# Patient Record
Sex: Female | Born: 1950 | Race: White | Hispanic: No | Smoking: Former smoker
Health system: Southern US, Community
[De-identification: ages and names within clinical notes are randomized; demographics above are authoritative.]

## PROBLEM LIST (undated history)

## (undated) DIAGNOSIS — E785 Hyperlipidemia, unspecified: Secondary | ICD-10-CM

## (undated) DIAGNOSIS — Z972 Presence of dental prosthetic device (complete) (partial): Secondary | ICD-10-CM

## (undated) DIAGNOSIS — J302 Other seasonal allergic rhinitis: Secondary | ICD-10-CM

## (undated) DIAGNOSIS — R42 Dizziness and giddiness: Secondary | ICD-10-CM

## (undated) DIAGNOSIS — T451X5A Adverse effect of antineoplastic and immunosuppressive drugs, initial encounter: Secondary | ICD-10-CM

## (undated) DIAGNOSIS — K259 Gastric ulcer, unspecified as acute or chronic, without hemorrhage or perforation: Secondary | ICD-10-CM

## (undated) DIAGNOSIS — M1990 Unspecified osteoarthritis, unspecified site: Secondary | ICD-10-CM

## (undated) DIAGNOSIS — I10 Essential (primary) hypertension: Secondary | ICD-10-CM

## (undated) DIAGNOSIS — C679 Malignant neoplasm of bladder, unspecified: Secondary | ICD-10-CM

## (undated) DIAGNOSIS — C801 Malignant (primary) neoplasm, unspecified: Secondary | ICD-10-CM

## (undated) DIAGNOSIS — K219 Gastro-esophageal reflux disease without esophagitis: Secondary | ICD-10-CM

## (undated) DIAGNOSIS — G620 Drug-induced polyneuropathy: Secondary | ICD-10-CM

## (undated) DIAGNOSIS — Z87442 Personal history of urinary calculi: Secondary | ICD-10-CM

## (undated) DIAGNOSIS — I480 Paroxysmal atrial fibrillation: Secondary | ICD-10-CM

## (undated) DIAGNOSIS — C9001 Multiple myeloma in remission: Secondary | ICD-10-CM

## (undated) DIAGNOSIS — M109 Gout, unspecified: Secondary | ICD-10-CM

## (undated) DIAGNOSIS — E7800 Pure hypercholesterolemia, unspecified: Secondary | ICD-10-CM

## (undated) DIAGNOSIS — F419 Anxiety disorder, unspecified: Secondary | ICD-10-CM

## (undated) HISTORY — DX: Gastro-esophageal reflux disease without esophagitis: K21.9

## (undated) HISTORY — PX: ABDOMINAL HYSTERECTOMY: SHX81

## (undated) HISTORY — DX: Anxiety disorder, unspecified: F41.9

## (undated) HISTORY — PX: HERNIA REPAIR: SHX51

## (undated) HISTORY — PX: EYE SURGERY: SHX253

## (undated) HISTORY — DX: Hyperlipidemia, unspecified: E78.5

## (undated) HISTORY — DX: Unspecified osteoarthritis, unspecified site: M19.90

## (undated) HISTORY — PX: KIDNEY STONE SURGERY: SHX686

## (undated) HISTORY — DX: Gout, unspecified: M10.9

## (undated) HISTORY — DX: Gastric ulcer, unspecified as acute or chronic, without hemorrhage or perforation: K25.9

## (undated) HISTORY — DX: Personal history of urinary calculi: Z87.442

## (undated) HISTORY — PX: BLADDER ASPIRATION: SHX472

## (undated) HISTORY — PX: HEMORROIDECTOMY: SUR656

## (undated) HISTORY — DX: Malignant neoplasm of bladder, unspecified: C67.9

## (undated) HISTORY — PX: TUMOR REMOVAL: SHX12

## (undated) HISTORY — PX: GALLBLADDER SURGERY: SHX652

---

## 1997-03-07 HISTORY — PX: BLADDER SURGERY: SHX569

## 2011-06-16 DIAGNOSIS — E785 Hyperlipidemia, unspecified: Secondary | ICD-10-CM | POA: Insufficient documentation

## 2011-06-16 DIAGNOSIS — I10 Essential (primary) hypertension: Secondary | ICD-10-CM | POA: Insufficient documentation

## 2012-03-20 DIAGNOSIS — R3915 Urgency of urination: Secondary | ICD-10-CM | POA: Insufficient documentation

## 2012-03-20 DIAGNOSIS — N329 Bladder disorder, unspecified: Secondary | ICD-10-CM | POA: Insufficient documentation

## 2012-03-20 DIAGNOSIS — R350 Frequency of micturition: Secondary | ICD-10-CM | POA: Insufficient documentation

## 2014-03-04 DIAGNOSIS — K219 Gastro-esophageal reflux disease without esophagitis: Secondary | ICD-10-CM | POA: Insufficient documentation

## 2014-03-07 DIAGNOSIS — C801 Malignant (primary) neoplasm, unspecified: Secondary | ICD-10-CM

## 2014-03-07 HISTORY — DX: Malignant (primary) neoplasm, unspecified: C80.1

## 2014-09-16 DIAGNOSIS — R1314 Dysphagia, pharyngoesophageal phase: Secondary | ICD-10-CM | POA: Insufficient documentation

## 2017-03-08 DIAGNOSIS — N201 Calculus of ureter: Secondary | ICD-10-CM | POA: Insufficient documentation

## 2017-03-08 DIAGNOSIS — N179 Acute kidney failure, unspecified: Secondary | ICD-10-CM | POA: Insufficient documentation

## 2017-06-13 ENCOUNTER — Encounter: Payer: Self-pay | Admitting: *Deleted

## 2017-06-13 ENCOUNTER — Ambulatory Visit
Admission: EM | Admit: 2017-06-13 | Discharge: 2017-06-13 | Disposition: A | Discharge status: Home / Self Care | Payer: Medicare Other | Attending: Family Medicine | Admitting: Family Medicine

## 2017-06-13 DIAGNOSIS — J309 Allergic rhinitis, unspecified: Secondary | ICD-10-CM | POA: Diagnosis not present

## 2017-06-13 DIAGNOSIS — J302 Other seasonal allergic rhinitis: Secondary | ICD-10-CM | POA: Diagnosis not present

## 2017-06-13 DIAGNOSIS — H1013 Acute atopic conjunctivitis, bilateral: Secondary | ICD-10-CM

## 2017-06-13 DIAGNOSIS — J3089 Other allergic rhinitis: Secondary | ICD-10-CM | POA: Diagnosis not present

## 2017-06-13 DIAGNOSIS — H6983 Other specified disorders of Eustachian tube, bilateral: Secondary | ICD-10-CM

## 2017-06-13 HISTORY — DX: Pure hypercholesterolemia, unspecified: E78.00

## 2017-06-13 HISTORY — DX: Essential (primary) hypertension: I10

## 2017-06-13 MED ORDER — ERYTHROMYCIN 5 MG/GM OP OINT: TOPICAL_OINTMENT | OPHTHALMIC | 0 refills | Status: DC

## 2017-06-13 MED ORDER — OLOPATADINE HCL 0.2 % OP SOLN: 1.0000 [drp] | Freq: Every day | OPHTHALMIC | 0 refills | Status: DC

## 2017-06-13 NOTE — ED Provider Notes (Signed)
[facility]    CSN: [id number] Arrival date & time: 06/13/17  0831     History   Chief Complaint Chief Complaint  Patient presents with  . Conjunctivitis    HPI Samantha Blankenship is a 67 y.o. female.   HPI  67 year old female accompanied by her husband presents with itching and drainage of both eyes.  She is also complaining of bilateral earache she feels deep in her ear and runs along the inferior jaw line.  Recently relocated here from [facility].  She had cold-like symptoms about 1 week ago.  She has been on the porch recently also out in her yard prior to worsening of her symptoms.  This morning and had matting of her eyes.  They have been very itchy.  She has no foreign body sensation does not wear contacts and has had no eye pain.  Does not specifically complain of eye pain.  History of double vision.  Her symptoms seem to worsen after she had taken 1 dose of Zyrtec        Past Medical History:  Diagnosis Date  . High cholesterol   . Hypertension     There are no active problems to display for this patient.   Past Surgical History:  Procedure Laterality Date  . BLADDER ASPIRATION    . EYE SURGERY    . GALLBLADDER SURGERY    . HEMORROIDECTOMY    . HERNIA REPAIR    . KIDNEY STONE SURGERY    . TUMOR REMOVAL      OB History   None      Home Medications    Prior to Admission medications   Medication Sig Start Date End Date Taking? Authorizing Provider  fenofibrate micronized (LOFIBRA) 134 MG capsule Take 134 mg by mouth daily before breakfast.   Yes [provider]  losartan (COZAAR) 100 MG tablet Take 100 mg by mouth daily.   Yes [provider]  metoprolol tartrate (LOPRESSOR) 50 MG tablet Take 50 mg by mouth 2 (two) times daily.   Yes [provider]  prenatal vitamin w/FE, FA (NATACHEW) 29-1 MG CHEW chewable tablet Chew 1 tablet by mouth daily at 12 noon.   Yes [provider]  simvastatin (ZOCOR) 80 MG  tablet Take 80 mg by mouth daily.   Yes [provider]  solifenacin (VESICARE) 5 MG tablet Take 5 mg by mouth daily.   Yes [provider]  erythromycin ophthalmic ointment Place a 1/2 inch ribbon of ointment into the lower eyelid. 06/13/17   Lorin Picket, PA-C  Olopatadine HCl 0.2 % SOLN Apply 1 drop to eye daily. 06/13/17   Lorin Picket, PA-C    Family History History reviewed. No pertinent family history.  Social History Social History   Tobacco Use  . Smoking status: Former Research scientist (life sciences)  . Smokeless tobacco: Never Used  Substance Use Topics  . Alcohol use: Never    Frequency: Never  . Drug use: Never     Allergies   Aspirin; Penicillins; and Sulfa antibiotics   Review of Systems Review of Systems  Constitutional: Positive for activity change. Negative for chills, fatigue and fever.  HENT: Positive for ear pain.   Eyes: Positive for discharge and itching.  All other systems reviewed and are negative.    Physical Exam Triage Vital Signs ED Triage Vitals  Enc Vitals Group     BP 06/13/17 0912 (!) 152/69     Pulse Rate 06/13/17 0912 71  Resp 06/13/17 0912 16     Temp 06/13/17 0912 97.6 F (36.4 C)     Temp Source 06/13/17 0912 Oral     SpO2 06/13/17 0912 98 %     Weight 06/13/17 0905 185 lb (83.9 kg)     Height 06/13/17 0905 5\' 8"  (1.727 m)     Head Circumference --      Peak Flow --      Pain Score 06/13/17 0905 5     Pain Loc --      Pain Edu? --      Excl. in [place]? --    No data found.  Updated Vital Signs BP (!) 152/69 (BP Location: Left Arm)   Pulse 71   Temp 97.6 F (36.4 C) (Oral)   Resp 16   Ht 5\' 8"  (1.727 m)   Wt 185 lb (83.9 kg)   SpO2 98%   BMI 28.13 kg/m   Visual Acuity Right Eye Distance: 20/30(uncorrected) Left Eye Distance: 20/70(uncorrected) Bilateral Distance: 20/30(uncorrected)  Right Eye Near:   Left Eye Near:    Bilateral Near:     Physical Exam  Constitutional: She is oriented to person, place,  and time. She appears well-developed and well-nourished. No distress.  HENT:  Head: Normocephalic.  Right Ear: External ear normal.  Left Ear: External ear normal.  Nose: Nose normal.  Mouth/Throat: Oropharynx is clear and moist. No oropharyngeal exudate.  Eyes: Pupils are equal, round, and reactive to light. Right eye exhibits discharge. Left eye exhibits discharge.   Examination of the eyes shows PERRLA. EOM are intact.  Has bilateral crusting of her lashes.  The discharge in the eyes are watery however the debris appears purulent.  Acuity increased in the left eye but the patient states that she is known this and has always been the same.  Neck: Normal range of motion.  Pulmonary/Chest: Effort normal and breath sounds normal.  Musculoskeletal: Normal range of motion.  Neurological: She is alert and oriented to person, place, and time.  Skin: Skin is warm and dry. She is not diaphoretic.  Psychiatric: She has a normal mood and affect. Her behavior is normal. Judgment and thought content normal.  Nursing note and vitals reviewed.    UC Treatments / Results  Labs (all labs ordered are listed, but only abnormal results are displayed) Labs Reviewed - No data to display  EKG None Radiology No results found.  Procedures Procedures (including critical care time)  Medications Ordered in UC Medications - No data to display   Initial Impression / Assessment and Plan / UC Course  I have reviewed the triage vital signs and the nursing notes.  Pertinent labs & imaging results that were available during my care of the patient were reviewed by me and considered in my medical decision making (see chart for details).     Plan: 1. Test/x-ray results and diagnosis reviewed with patient 2. rx as per orders; risks, benefits, potential side effects reviewed with patient 3. Recommend supportive treatment with of the eyedrops once daily and application of erythromycin ointment as directed.   Also recommend the use of Flonase nasal spray and Claritin or Allegra on a daily basis.  She feels that the Zyrtec may have contributed to her symptoms. 4. F/u prn if symptoms worsen or don't improve   Final Clinical Impressions(s) / UC Diagnoses   Final diagnoses:  Allergic conjunctivitis and rhinitis, bilateral  Seasonal and perennial allergic rhinitis  Dysfunction of both eustachian tubes  ED Discharge Orders        Ordered    Olopatadine HCl 0.2 % SOLN  Daily     06/13/17 0952    erythromycin ophthalmic ointment     06/13/17 0959       Controlled Substance Prescriptions [place] Controlled Substance Registry consulted? Not Applicable   Lorin Picket, PA-C 06/13/17 1552

## 2017-06-13 NOTE — ED Triage Notes (Signed)
Pt c/o itching and drainage to both eyes. Symptoms started yesterday. Pt took Zyrtec yesterday, but it only made things worse. Pt also c/o bilateral ear ache.

## 2017-06-13 NOTE — Discharge Instructions (Addendum)
Use cool compresses to your eyes as often as necessary for comfort.  Try Claritin on a daily basis along with Flonase daily.  Start off with 2 sprays of Flonase for the first 2 days and then decrease by 1 spray daily thereafter.  I would recommend using it over the entire pollen /spring season.

## 2017-06-16 ENCOUNTER — Other Ambulatory Visit: Payer: Self-pay

## 2017-06-16 ENCOUNTER — Ambulatory Visit
Admission: EM | Admit: 2017-06-16 | Discharge: 2017-06-16 | Disposition: A | Discharge status: Home / Self Care | Payer: Medicare Other | Attending: Family Medicine | Admitting: Family Medicine

## 2017-06-16 ENCOUNTER — Encounter: Payer: Self-pay | Admitting: Gynecology

## 2017-06-16 DIAGNOSIS — H6502 Acute serous otitis media, left ear: Secondary | ICD-10-CM

## 2017-06-16 DIAGNOSIS — H6500 Acute serous otitis media, unspecified ear: Secondary | ICD-10-CM

## 2017-06-16 MED ORDER — AZITHROMYCIN 250 MG PO TABS: ORAL_TABLET | ORAL | 0 refills | Status: DC

## 2017-06-16 NOTE — ED Triage Notes (Signed)
Per patient c/o left ear pain x couples days. Per patient ear painful when coughing.

## 2017-06-16 NOTE — ED Provider Notes (Signed)
[facility]    CSN: [id number] Arrival date & time: 06/16/17  [place]     History   Chief Complaint Chief Complaint  Patient presents with  . Otalgia    HPI Samantha Blankenship is a 67 y.o. female.   The history is provided by the patient.  Otalgia  Location:  Left Behind ear:  No abnormality Quality:  Aching Severity:  Moderate Onset quality:  Sudden Duration:  2 days Timing:  Constant Progression:  Worsening Chronicity:  New Context: recent URI   Context: not direct blow, not elevation change, not foreign body in ear, not loud noise and not water in ear   Relieved by:  None tried Ineffective treatments:  None tried Associated symptoms: rhinorrhea   Associated symptoms: no abdominal pain, no congestion, no cough, no diarrhea, no ear discharge, no fever, no headaches, no hearing loss, no neck pain, no rash, no sore throat, no tinnitus and no vomiting   Risk factors: no recent travel and no prior ear surgery     Past Medical History:  Diagnosis Date  . High cholesterol   . Hypertension     There are no active problems to display for this patient.   Past Surgical History:  Procedure Laterality Date  . BLADDER ASPIRATION    . EYE SURGERY    . GALLBLADDER SURGERY    . HEMORROIDECTOMY    . HERNIA REPAIR    . KIDNEY STONE SURGERY    . TUMOR REMOVAL      OB History   None      Home Medications    Prior to Admission medications   Medication Sig Start Date End Date Taking? Authorizing Provider  erythromycin ophthalmic ointment Place a 1/2 inch ribbon of ointment into the lower eyelid. 06/13/17  Yes Lorin Picket, PA-C  fenofibrate micronized (LOFIBRA) 134 MG capsule Take 134 mg by mouth daily before breakfast.   Yes [provider]  losartan (COZAAR) 100 MG tablet Take 100 mg by mouth daily.   Yes [provider]  metoprolol tartrate (LOPRESSOR) 50 MG tablet Take 50 mg by mouth 2 (two) times daily.   Yes [provider]    Olopatadine HCl 0.2 % SOLN Apply 1 drop to eye daily. 06/13/17  Yes Lorin Picket, PA-C  prenatal vitamin w/FE, FA (NATACHEW) 29-1 MG CHEW chewable tablet Chew 1 tablet by mouth daily at 12 noon.   Yes [provider]  simvastatin (ZOCOR) 80 MG tablet Take 80 mg by mouth daily.   Yes [provider]  solifenacin (VESICARE) 5 MG tablet Take 5 mg by mouth daily.   Yes [provider]  azithromycin (ZITHROMAX Z-PAK) 250 MG tablet 2 tabs po once day 1, then 1 tab po qd for next 4 days 06/16/17   Norval Gable, MD    Family History Family History  Problem Relation Age of Onset  . Hypertension Mother   . Heart failure Father     Social History Social History   Tobacco Use  . Smoking status: Former Research scientist (life sciences)  . Smokeless tobacco: Never Used  Substance Use Topics  . Alcohol use: Never    Frequency: Never  . Drug use: Never     Allergies   Aspirin; Penicillins; and Sulfa antibiotics   Review of Systems Review of Systems  Constitutional: Negative for fever.  HENT: Positive for ear pain and rhinorrhea. Negative for congestion, ear discharge, hearing loss, sore throat and tinnitus.   Respiratory: Negative for  cough.   Gastrointestinal: Negative for abdominal pain, diarrhea and vomiting.  Musculoskeletal: Negative for neck pain.  Skin: Negative for rash.  Neurological: Negative for headaches.     Physical Exam Triage Vital Signs ED Triage Vitals  Enc Vitals Group     BP 06/16/17 1852 (!) 168/76     Pulse Rate 06/16/17 1852 87     Resp 06/16/17 1850 16     Temp 06/16/17 1852 98.5 F (36.9 C)     Temp Source 06/16/17 1850 Oral     SpO2 06/16/17 1852 99 %     Weight 06/16/17 1851 185 lb (83.9 kg)     Height --      Head Circumference --      Peak Flow --      Pain Score 06/16/17 1851 8     Pain Loc --      Pain Edu? --      Excl. in [place]? --    No data found.  Updated Vital Signs BP (!) 168/76   Pulse 87   Temp 98.5 F (36.9 C) (Oral)    Resp 16   Wt 185 lb (83.9 kg)   SpO2 99%   BMI 28.13 kg/m   Visual Acuity Right Eye Distance:   Left Eye Distance:   Bilateral Distance:    Right Eye Near:   Left Eye Near:    Bilateral Near:     Physical Exam  Constitutional: She appears well-developed and well-nourished. No distress.  HENT:  Head: Normocephalic and atraumatic.  Right Ear: Tympanic membrane normal.  Left Ear: Tympanic membrane is erythematous and bulging. A middle ear effusion is present.  Nose: Nose normal.  Mouth/Throat: Oropharynx is clear and moist. No oropharyngeal exudate, posterior oropharyngeal edema, posterior oropharyngeal erythema or tonsillar abscesses. No tonsillar exudate.  Neck: Normal range of motion. Neck supple. No tracheal deviation present.  Cardiovascular: Normal rate.  Pulmonary/Chest: Effort normal. No respiratory distress.  Skin: She is not diaphoretic.  Nursing note and vitals reviewed.    UC Treatments / Results  Labs (all labs ordered are listed, but only abnormal results are displayed) Labs Reviewed - No data to display  EKG None Radiology No results found.  Procedures Procedures (including critical care time)  Medications Ordered in UC Medications - No data to display   Initial Impression / Assessment and Plan / UC Course  I have reviewed the triage vital signs and the nursing notes.  Pertinent labs & imaging results that were available during my care of the patient were reviewed by me and considered in my medical decision making (see chart for details).       Final Clinical Impressions(s) / UC Diagnoses   Final diagnoses:  Acute serous otitis media, recurrence not specified, unspecified laterality    ED Discharge Orders        Ordered    azithromycin (ZITHROMAX Z-PAK) 250 MG tablet     06/16/17 1915     1. diagnosis reviewed with patient 2. rx as per orders above; reviewed possible side effects, interactions, risks and benefits  3. Recommend  supportive treatment with otc analgesics prn 4. Follow-up prn if symptoms worsen or don't improve  Controlled Substance Prescriptions [place] Controlled Substance Registry consulted? Not Applicable   Norval Gable, MD 06/16/17 [phone]

## 2017-06-21 ENCOUNTER — Other Ambulatory Visit: Payer: Self-pay | Admitting: Family Medicine

## 2017-06-21 DIAGNOSIS — Z1239 Encounter for other screening for malignant neoplasm of breast: Secondary | ICD-10-CM

## 2017-06-21 DIAGNOSIS — Z13820 Encounter for screening for osteoporosis: Secondary | ICD-10-CM

## 2017-06-21 DIAGNOSIS — R7303 Prediabetes: Secondary | ICD-10-CM | POA: Insufficient documentation

## 2017-07-06 ENCOUNTER — Ambulatory Visit
Admission: RE | Admit: 2017-07-06 | Discharge: 2017-07-06 | Disposition: A | Discharge status: Home / Self Care | Payer: Medicare Other | Source: Ambulatory Visit | Attending: Family Medicine | Admitting: Family Medicine

## 2017-07-06 DIAGNOSIS — Z1231 Encounter for screening mammogram for malignant neoplasm of breast: Secondary | ICD-10-CM | POA: Diagnosis not present

## 2017-07-06 DIAGNOSIS — Z13820 Encounter for screening for osteoporosis: Secondary | ICD-10-CM | POA: Insufficient documentation

## 2017-07-06 DIAGNOSIS — Z1239 Encounter for other screening for malignant neoplasm of breast: Secondary | ICD-10-CM

## 2017-07-06 HISTORY — DX: Malignant (primary) neoplasm, unspecified: C80.1

## 2017-07-18 ENCOUNTER — Other Ambulatory Visit: Payer: Self-pay | Admitting: *Deleted

## 2017-07-18 ENCOUNTER — Inpatient Hospital Stay
Admission: RE | Admit: 2017-07-18 | Discharge: 2017-07-18 | Disposition: A | Discharge status: Home / Self Care | Payer: Self-pay | Source: Ambulatory Visit | Attending: *Deleted | Admitting: *Deleted

## 2017-07-18 DIAGNOSIS — Z9289 Personal history of other medical treatment: Secondary | ICD-10-CM

## 2017-07-28 ENCOUNTER — Ambulatory Visit: Payer: Self-pay | Admitting: Urology

## 2017-08-09 ENCOUNTER — Ambulatory Visit: Payer: Self-pay | Admitting: Urology

## 2017-08-10 ENCOUNTER — Other Ambulatory Visit: Payer: Self-pay

## 2017-08-10 DIAGNOSIS — Z87442 Personal history of urinary calculi: Secondary | ICD-10-CM

## 2017-08-11 ENCOUNTER — Ambulatory Visit (INDEPENDENT_AMBULATORY_CARE_PROVIDER_SITE_OTHER): Payer: Medicare Other | Admitting: Urology

## 2017-08-11 ENCOUNTER — Other Ambulatory Visit
Admission: RE | Admit: 2017-08-11 | Discharge: 2017-08-11 | Disposition: A | Discharge status: Home / Self Care | Payer: Medicare Other | Source: Ambulatory Visit | Attending: Urology | Admitting: Urology

## 2017-08-11 ENCOUNTER — Encounter: Payer: Self-pay | Admitting: Urology

## 2017-08-11 ENCOUNTER — Other Ambulatory Visit: Payer: Self-pay

## 2017-08-11 VITALS — BP 160/85 | HR 72 | Ht 68.0 in | Wt 189.0 lb

## 2017-08-11 DIAGNOSIS — Z87442 Personal history of urinary calculi: Secondary | ICD-10-CM

## 2017-08-11 DIAGNOSIS — N281 Cyst of kidney, acquired: Secondary | ICD-10-CM

## 2017-08-11 DIAGNOSIS — N2889 Other specified disorders of kidney and ureter: Secondary | ICD-10-CM | POA: Diagnosis not present

## 2017-08-11 DIAGNOSIS — C679 Malignant neoplasm of bladder, unspecified: Secondary | ICD-10-CM | POA: Insufficient documentation

## 2017-08-11 DIAGNOSIS — M1990 Unspecified osteoarthritis, unspecified site: Secondary | ICD-10-CM | POA: Insufficient documentation

## 2017-08-11 DIAGNOSIS — N200 Calculus of kidney: Secondary | ICD-10-CM | POA: Insufficient documentation

## 2017-08-11 DIAGNOSIS — N3281 Overactive bladder: Secondary | ICD-10-CM

## 2017-08-11 DIAGNOSIS — N329 Bladder disorder, unspecified: Secondary | ICD-10-CM

## 2017-08-11 DIAGNOSIS — J302 Other seasonal allergic rhinitis: Secondary | ICD-10-CM | POA: Insufficient documentation

## 2017-08-11 NOTE — Progress Notes (Signed)
08/11/2017 2:11 PM   Samantha Blankenship [birth year] [id number]  Referring provider: [facility] [address] [place], [place] [address]  Chief Complaint  Patient presents with  . Establish Care    HPI: 67 yo F with multiple GU issues who presents today to establish care.  She is previously followed at [facility] by her urologist, Dr. Anabel Bene was also seen and evaluated for kidney stone at [facility] u[facility] earlier this year.   Kidney stones Presented to the emergency room with a 7 mm left proximal ureteral stone in 03/2017 to do.  She subsequently underwent left ureteroscopy and her stent was removed.  No complications.  Renal mass Incidental indeterminate left renal mass, 1.3 cm of the left upper pole on noncontrast CT scan obtained at the time of above stone episode.  She was advised she needs a follow-up CT abdomen with and without contrast for further characterization of this.  She is yet to have any additional imaging follow-up.  No flank pain.  No gross hematuria.  No family history of renal malignancy.  OAB Previously on vesicare in 03/2017 for OAB.  Prevously taking for urgency/ frequency but symptoms have stopped.  Nocturia x 4 but minimal bother from this.  No incontience.  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    History of bladder mass Previously followed in [facility] by urologist for a "bladder mass". 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  There was no evidence of malignancy or atypia.  She has been followed with serial cystoscopy as well as urine cytologies.   PMH: Past Medical History:  Diagnosis Date  . Anxiety   . Arthritis   . Bladder cancer ([place])   . Cancer ([place]) 2016   bladder ca  . GERD (gastroesophageal reflux disease)   . Gout   . High cholesterol   . History of kidney stones   . Hyperlipidemia   . Hypertension   . Stomach ulcer      Surgical History: Past Surgical History:  Procedure Laterality Date  . ABDOMINAL HYSTERECTOMY    . BLADDER ASPIRATION    . BLADDER SURGERY  1999  . EYE SURGERY    . GALLBLADDER SURGERY    . HEMORROIDECTOMY    . HERNIA REPAIR    . KIDNEY STONE SURGERY    . TUMOR REMOVAL      Home Medications:  Allergies as of 08/11/2017      Reactions   Aspirin Shortness Of Breath, Swelling   Carbinoxamine Shortness Of Breath, Swelling   Cetirizine Shortness Of Breath, Swelling   Coffea Arabica Shortness Of Breath   Other Shortness Of Breath, Swelling, Other (See Comments)   Shellfish BLISTERS   Shellfish Allergy Shortness Of Breath, Swelling   Sulfasalazine Palpitations, Rash, Swelling   Grapefruit Extract Swelling   Okra Swelling   Penicillins    Sulfa Antibiotics    Pseudoephedrine Palpitations      Medication List        Accurate as of 08/11/17 11:59 PM. Always use your most recent med list.          erythromycin ophthalmic ointment Place a 1/2 inch ribbon of ointment into the lower eyelid.   fenofibrate micronized 134 MG capsule Commonly known as:  LOFIBRA Take 134 mg by mouth daily before breakfast.   hydrocortisone 25 MG suppository Commonly known as:  ANUSOL-HC Place rectally.   losartan 100 MG tablet  Commonly known as:  COZAAR Take 100 mg by mouth daily.   metoprolol tartrate 50 MG tablet Commonly known as:  LOPRESSOR Take 50 mg by mouth 2 (two) times daily.   multivitamin capsule Take by mouth.   Olopatadine HCl 0.2 % Soln Apply 1 drop to eye daily.   prenatal vitamin w/FE, FA 29-1 MG Chew chewable tablet Chew 1 tablet by mouth daily at 12 noon.   simvastatin 80 MG tablet Commonly known as:  ZOCOR Take 80 mg by mouth daily.   ZOFRAN 4 MG tablet Generic drug:  ondansetron Take by mouth.       Allergies:  Allergies  Allergen Reactions  . Aspirin Shortness Of Breath and Swelling  . Carbinoxamine Shortness Of Breath and Swelling  .  Cetirizine Shortness Of Breath and Swelling  . Coffea Arabica Shortness Of Breath  . Other Shortness Of Breath, Swelling and Other (See Comments)    Shellfish BLISTERS  . Shellfish Allergy Shortness Of Breath and Swelling  . Sulfasalazine Palpitations, Rash and Swelling  . Grapefruit Extract Swelling  . Okra Swelling  . Penicillins   . Sulfa Antibiotics   . Pseudoephedrine Palpitations    Family History: Family History  Problem Relation Age of Onset  . Hypertension Mother   . Heart failure Father   . Prostate cancer Maternal Grandfather   . Breast cancer Neg Hx     Social History:  reports that she has quit smoking. She has never used smokeless tobacco. She reports that she does not drink alcohol or use drugs.  ROS: UROLOGY Frequent Urination?: No Hard to postpone urination?: No Burning/pain with urination?: No Get up at night to urinate?: Yes Leakage of urine?: No Urine stream starts and stops?: No Trouble starting stream?: No Do you have to strain to urinate?: No Blood in urine?: No Urinary tract infection?: No Sexually transmitted disease?: No Injury to kidneys or bladder?: No Painful intercourse?: No Weak stream?: No Currently pregnant?: No Vaginal bleeding?: No Last menstrual period?: Hysterectomy   Gastrointestinal Nausea?: No Vomiting?: No Indigestion/heartburn?: No Diarrhea?: No Constipation?: No  Constitutional Fever: No Night sweats?: No Weight loss?: No Fatigue?: No  Skin Skin rash/lesions?: No Itching?: No  Eyes Blurred vision?: No Double vision?: No  Ears/Nose/Throat Sore throat?: No Sinus problems?: No  Hematologic/Lymphatic Swollen glands?: No Easy bruising?: No  Cardiovascular Leg swelling?: No Chest pain?: No  Respiratory Cough?: No Shortness of breath?: No  Endocrine Excessive thirst?: No  Musculoskeletal Back pain?: No Joint pain?: No  Neurological Headaches?: No Dizziness?: No  Psychologic Depression?:  No Anxiety?: No  Physical Exam: BP (!) 160/85 (BP Location: Left Arm, Patient Position: Sitting, Cuff Size: Large)   Pulse 72   Ht 5\' 8"  (1.727 m)   Wt 189 lb (85.7 kg)   SpO2 99%   BMI 28.74 kg/m   Constitutional:  Alert and oriented, No acute distress. HEENT: [place] AT, moist mucus membranes.  Trachea midline, no masses. Cardiovascular: No clubbing, cyanosis, or edema. Respiratory: Normal respiratory effort, no increased work of breathing. GI: Abdomen is soft, nontender, nondistended, no abdominal masses GU: No CVA tenderness Skin: No rashes, bruises or suspicious lesions. Neurologic: Grossly intact, no focal deficits, moving all 4 extremities. Psychiatric: Normal mood and affect.  Laboratory Data: Labs from care everywhere reviewed, most recent creatinine 1.1 on 06/2017  Pertinent Imaging: NA  Assessment & Plan:    1. History of kidney stones Status post recent left ureteroscopy at [facility] recommendations reviewed will evaluate  for nonobstructing stones with imaging as below  2. OAB (overactive bladder) Previously on anticholinergic medication Currently relatively asymptomatic No further intervention at this time  3. Lesion of bladder Personal history of lesion of the bladder which is been followed cystoscopically with serial cytology Review records reveal that she has in fact no real history of malignancy or dysplasia this continued surveillance is not indicated She is agreeable with this plan  4. Renal mass Questionable indeterminate left renal lesion Recommend CT abdomen with and without contrast Patient is agreeable this plan to rule out underlying malignancy  5. Acquired cyst of kidney Above - CT Abd Wo & W Cm; Future   Return in about 1 month (around 09/08/2017) for f/u CT scan/ record reviewed (Sign release from Dr.  Anabel Bene).  Hollice Espy, MD  [facility] [address], [place], [place] [address] [phone]  Extensive review of records today through care everywhere both in the [place] and [place].

## 2017-10-04 ENCOUNTER — Other Ambulatory Visit: Payer: Self-pay | Admitting: Family Medicine

## 2017-10-04 DIAGNOSIS — Z87442 Personal history of urinary calculi: Secondary | ICD-10-CM

## 2017-10-05 ENCOUNTER — Other Ambulatory Visit
Admission: RE | Admit: 2017-10-05 | Discharge: 2017-10-05 | Disposition: A | Discharge status: Home / Self Care | Payer: Medicare Other | Source: Ambulatory Visit | Attending: Urology | Admitting: Urology

## 2017-10-05 ENCOUNTER — Ambulatory Visit
Admission: RE | Admit: 2017-10-05 | Discharge: 2017-10-05 | Disposition: A | Discharge status: Home / Self Care | Payer: Medicare Other | Source: Ambulatory Visit | Attending: Urology | Admitting: Urology

## 2017-10-05 DIAGNOSIS — N281 Cyst of kidney, acquired: Secondary | ICD-10-CM | POA: Insufficient documentation

## 2017-10-05 DIAGNOSIS — K760 Fatty (change of) liver, not elsewhere classified: Secondary | ICD-10-CM | POA: Insufficient documentation

## 2017-10-05 DIAGNOSIS — I700 Atherosclerosis of aorta: Secondary | ICD-10-CM | POA: Insufficient documentation

## 2017-10-05 DIAGNOSIS — N200 Calculus of kidney: Secondary | ICD-10-CM | POA: Insufficient documentation

## 2017-10-05 DIAGNOSIS — Z87442 Personal history of urinary calculi: Secondary | ICD-10-CM

## 2017-10-05 LAB — CREATININE, SERUM
CREATININE: 1.05 mg/dL — AB (ref 0.44–1.00)
GFR calc Af Amer: >60 mL/min (ref >60)
GFR calc non Af Amer: 53 mL/min — ABNORMAL LOW (ref >60)

## 2017-10-05 LAB — BUN: BUN: 22 mg/dL (ref 8–23)

## 2017-10-05 MED ORDER — IOPAMIDOL (ISOVUE-370) INJECTION 76%
100.0000 mL | Freq: Once | INTRAVENOUS | Status: AC | PRN
  Administered 2017-10-05: 115 mL via INTRAVENOUS

## 2017-10-06 ENCOUNTER — Ambulatory Visit (INDEPENDENT_AMBULATORY_CARE_PROVIDER_SITE_OTHER): Payer: Medicare Other | Admitting: Urology

## 2017-10-06 ENCOUNTER — Encounter: Payer: Self-pay | Admitting: Urology

## 2017-10-06 VITALS — BP 114/67 | HR 65 | Ht 68.0 in | Wt 185.0 lb

## 2017-10-06 DIAGNOSIS — N281 Cyst of kidney, acquired: Secondary | ICD-10-CM | POA: Diagnosis not present

## 2017-10-06 DIAGNOSIS — N200 Calculus of kidney: Secondary | ICD-10-CM | POA: Diagnosis not present

## 2017-10-06 DIAGNOSIS — N329 Bladder disorder, unspecified: Secondary | ICD-10-CM | POA: Diagnosis not present

## 2017-10-06 NOTE — Progress Notes (Signed)
10/06/2017 8:54 PM   Samantha Blankenship 03-05-1951 [id number]  Referring provider: [facility] [address] [place], [place] [address]  Chief Complaint  Patient presents with  . Results    1 month    HPI: 67 year old female with multiple GU issues returns in follow-up CT abdomen with and without contrast for an indeterminate right renal lesion.   Please see previous notes for details.  She underwent CT abdomen pelvis with and without contrast revealed multiple right-sided renal cyst, Bosniak 1 and 2 largest of which was measuring 2.3 cm in the right upper pole as well as a 1.7 cm in the right upper pole.  She does also have a stone measuring 9 mm in the right kidney  No flank pain.  PMH: Past Medical History:  Diagnosis Date  . Anxiety   . Arthritis   . Bladder cancer ([place])   . Cancer ([place]) 2016   bladder ca  . GERD (gastroesophageal reflux disease)   . Gout   . High cholesterol   . History of kidney stones   . Hyperlipidemia   . Hypertension   . Stomach ulcer     Surgical History: Past Surgical History:  Procedure Laterality Date  . ABDOMINAL HYSTERECTOMY    . BLADDER ASPIRATION    . BLADDER SURGERY  1999  . EYE SURGERY    . GALLBLADDER SURGERY    . HEMORROIDECTOMY    . HERNIA REPAIR    . KIDNEY STONE SURGERY    . TUMOR REMOVAL      Home Medications:  Allergies as of 10/06/2017      Reactions   Aspirin Shortness Of Breath, Swelling   Carbinoxamine Shortness Of Breath, Swelling   Cetirizine Shortness Of Breath, Swelling   Coffea Arabica Shortness Of Breath   Other Shortness Of Breath, Swelling, Other (See Comments)   Shellfish BLISTERS   Shellfish Allergy Shortness Of Breath, Swelling   Sulfasalazine Palpitations, Rash, Swelling   Grapefruit Extract Swelling   Okra Swelling   Penicillins    Sulfa Antibiotics    Pseudoephedrine Palpitations      Medication List        Accurate as of 10/06/17  8:54 PM. Always use your most recent med  list.          fenofibrate micronized 134 MG capsule Commonly known as:  LOFIBRA Take 134 mg by mouth daily before breakfast.   hydrocortisone 25 MG suppository Commonly known as:  ANUSOL-HC Place rectally.   losartan 100 MG tablet Commonly known as:  COZAAR Take 100 mg by mouth daily.   metoprolol tartrate 50 MG tablet Commonly known as:  LOPRESSOR Take 50 mg by mouth 2 (two) times daily.   multivitamin capsule Take by mouth.   Olopatadine HCl 0.2 % Soln Apply 1 drop to eye daily.   prenatal vitamin w/FE, FA 29-1 MG Chew chewable tablet Chew 1 tablet by mouth daily at 12 noon.   simvastatin 80 MG tablet Commonly known as:  ZOCOR Take 80 mg by mouth daily.   ZOFRAN 4 MG tablet Generic drug:  ondansetron Take by mouth.       Allergies:  Allergies  Allergen Reactions  . Aspirin Shortness Of Breath and Swelling  . Carbinoxamine Shortness Of Breath and Swelling  . Cetirizine Shortness Of Breath and Swelling  . Coffea Arabica Shortness Of Breath  . Other Shortness Of Breath, Swelling and Other (See Comments)    Shellfish BLISTERS  . Shellfish Allergy Shortness  Of Breath and Swelling  . Sulfasalazine Palpitations, Rash and Swelling  . Grapefruit Extract Swelling  . Okra Swelling  . Penicillins   . Sulfa Antibiotics   . Pseudoephedrine Palpitations    Family History: Family History  Problem Relation Age of Onset  . Hypertension Mother   . Heart failure Father   . Prostate cancer Maternal Grandfather   . Breast cancer Neg Hx     Social History:  reports that she has quit smoking. She has never used smokeless tobacco. She reports that she does not drink alcohol or use drugs.  ROS: UROLOGY Frequent Urination?: No Hard to postpone urination?: No Burning/pain with urination?: No Get up at night to urinate?: No Leakage of urine?: No Urine stream starts and stops?: No Trouble starting stream?: No Do you have to strain to urinate?: No Blood in urine?:  No Urinary tract infection?: No Sexually transmitted disease?: No Injury to kidneys or bladder?: No Painful intercourse?: No Weak stream?: No Currently pregnant?: No Vaginal bleeding?: No Last menstrual period?: n  Gastrointestinal Nausea?: No Vomiting?: No Indigestion/heartburn?: No Diarrhea?: No Constipation?: No  Constitutional Fever: No Night sweats?: No Weight loss?: No Fatigue?: No  Skin Skin rash/lesions?: No Itching?: No  Eyes Blurred vision?: No Double vision?: No  Ears/Nose/Throat Sore throat?: No Sinus problems?: No  Hematologic/Lymphatic Swollen glands?: No Easy bruising?: No  Cardiovascular Leg swelling?: No Chest pain?: No  Respiratory Cough?: No Shortness of breath?: No  Endocrine Excessive thirst?: No  Musculoskeletal Back pain?: No Joint pain?: No  Neurological Headaches?: No Dizziness?: No  Psychologic Depression?: No Anxiety?: No  Physical Exam: BP 114/67   Pulse 65   Ht 5\' 8"  (1.727 m)   Wt 185 lb (83.9 kg)   BMI 28.13 kg/m   Constitutional:  Alert and oriented, No acute distress. HEENT: [place] AT, moist mucus membranes.  Trachea midline, no masses. Cardiovascular: No clubbing, cyanosis, or edema. Respiratory: Normal respiratory effort, no increased work of breathing. Skin: No rashes, bruises or suspicious lesions. Neurologic: Grossly intact, no focal deficits, moving all 4 extremities. Psychiatric: Normal mood and affect.  Laboratory Data:  Lab Results  Component Value Date   CREATININE 1.05 (H) 10/05/2017   Pertinent Imaging: CLINICAL DATA:  Evaluate acquired kidney cysts. History of bladder cancer and benign tumor removed from abdomen at age 89.  EXAM: CT ABDOMEN WITHOUT AND WITH CONTRAST  TECHNIQUE: Multidetector CT imaging of the abdomen was performed following the standard protocol before and following the bolus administration of intravenous contrast.  CONTRAST:  161mL ISOVUE-370 IOPAMIDOL  (ISOVUE-370) INJECTION 76%  COMPARISON:  None.  FINDINGS: Lower chest: No acute abnormality.  Hepatobiliary: There is diffuse hepatic steatosis identified. Previous cholecystectomy. Mild fusiform dilatation of the common bile duct measures up to 1.1 cm. No choledocholithiasis noted.  Pancreas: Unremarkable. No pancreatic ductal dilatation or surrounding inflammatory changes.  Spleen: Normal in size without focal abnormality.  Adrenals/Urinary Tract: Normal adrenal glands. Stone within the interpolar collecting system of the right kidney measures 0.9 cm. No left renal calculi. No hydronephrosis identified. There are 2 cysts within the right kidney. The largest arises from the inferior pole measuring 2.3 cm, image 79/8. This contains a thin area this that this contains a small, 3 mm mural calcification, image 42/5. Arising from the upper pole is an exophytic cyst measuring 1.7 cm. No internal areas of enhancing septation or mural nodularity associated with these cysts. No left kidney cysts.  Stomach/Bowel: The stomach appears normal. The visualized abdominal bowel  loops are unremarkable.  Vascular/Lymphatic: Aortic atherosclerosis noted without aneurysm. Left periaortic lymph node is measures 0.8 cm. No adenopathy identified.  Other: No free fluid or fluid collections.  Musculoskeletal: Mild lumbar spondylosis noted. No acute or significant osseous findings noted  IMPRESSION: 1. Right kidney Bosniak category 1 and 2 cysts. 2. Nonobstructing right renal calculus. 3. Hepatic steatosis 4.  Aortic Atherosclerosis (ICD10-I70.0).   Electronically Signed   By: Kerby Moors M.D.   On: 10/05/2017 13:59   Assessment & Plan:    1. Acquired cyst of kidney No evidence of malignancy or suspicious lesions in the kidney CT scan was personally reviewed today with the patient No further renal imaging indicated for this purpose  2. Lesion of bladder Although she  has no personal history of malignancy of the bladder, she would like to continue area surveillance at least 1 additional time We will plan for cystoscopy in 6 months was previously on annual surveillance If this cystoscopy is unremarkable, would recommend no further evaluation of the bladder  3. Kidney stones Incidental asymptomatic nonobstructing kidney stone We will continue to monitor   Return in about 6 months (around 04/08/2018) for cysto.  Hollice Espy, MD  [facility] [address] [place],  [address] [phone]

## 2018-01-03 ENCOUNTER — Ambulatory Visit
Admission: RE | Admit: 2018-01-03 | Discharge: 2018-01-03 | Disposition: A | Discharge status: Home / Self Care | Payer: Medicare Other | Source: Ambulatory Visit | Attending: Family Medicine | Admitting: Family Medicine

## 2018-01-03 ENCOUNTER — Inpatient Hospital Stay: Admission: RE | Admit: 2018-01-03 | Payer: Medicare Other | Source: Ambulatory Visit

## 2018-01-03 ENCOUNTER — Other Ambulatory Visit: Payer: Self-pay | Admitting: Family Medicine

## 2018-01-03 DIAGNOSIS — Z09 Encounter for follow-up examination after completed treatment for conditions other than malignant neoplasm: Secondary | ICD-10-CM | POA: Insufficient documentation

## 2018-01-03 DIAGNOSIS — Z8709 Personal history of other diseases of the respiratory system: Secondary | ICD-10-CM | POA: Diagnosis present

## 2018-01-03 DIAGNOSIS — J984 Other disorders of lung: Secondary | ICD-10-CM | POA: Diagnosis not present

## 2018-01-03 DIAGNOSIS — I517 Cardiomegaly: Secondary | ICD-10-CM | POA: Insufficient documentation

## 2018-01-24 ENCOUNTER — Ambulatory Visit
Admission: RE | Admit: 2018-01-24 | Discharge: 2018-01-24 | Disposition: A | Discharge status: Home / Self Care | Payer: Medicare Other | Source: Ambulatory Visit | Attending: Family Medicine | Admitting: Family Medicine

## 2018-01-24 ENCOUNTER — Other Ambulatory Visit: Payer: Self-pay | Admitting: Family Medicine

## 2018-01-24 DIAGNOSIS — R9389 Abnormal findings on diagnostic imaging of other specified body structures: Secondary | ICD-10-CM

## 2018-01-24 DIAGNOSIS — Z8709 Personal history of other diseases of the respiratory system: Secondary | ICD-10-CM | POA: Insufficient documentation

## 2018-03-11 ENCOUNTER — Encounter: Payer: Self-pay | Admitting: Emergency Medicine

## 2018-03-11 ENCOUNTER — Ambulatory Visit
Admission: EM | Admit: 2018-03-11 | Discharge: 2018-03-11 | Disposition: A | Discharge status: Home / Self Care | Payer: Medicare Other | Attending: Family Medicine | Admitting: Family Medicine

## 2018-03-11 ENCOUNTER — Other Ambulatory Visit: Payer: Self-pay

## 2018-03-11 DIAGNOSIS — H02846 Edema of left eye, unspecified eyelid: Secondary | ICD-10-CM

## 2018-03-11 DIAGNOSIS — H00014 Hordeolum externum left upper eyelid: Secondary | ICD-10-CM | POA: Diagnosis not present

## 2018-03-11 MED ORDER — ERYTHROMYCIN 5 MG/GM OP OINT: TOPICAL_OINTMENT | OPHTHALMIC | 0 refills | Status: DC

## 2018-03-11 NOTE — Discharge Instructions (Addendum)
Recommend apply Erythromycin ointment in left lower eyelid 3 times a day for at least the next 5 days. May apply warm compresses to area for comfort and to help with any drainage. Recommend call [facility] tomorrow to schedule appointment for follow-up and to be seen for history of vision issues.

## 2018-03-11 NOTE — ED Triage Notes (Signed)
Patient in today c/o left eye swelling x 2 days.

## 2018-03-11 NOTE — ED Provider Notes (Signed)
[facility]    CSN: [id number] Arrival date & time: 03/11/18  0865     History   Chief Complaint Chief Complaint  Patient presents with  . Facial Swelling    left eye    HPI Samantha Blankenship is a 68 y.o. female.   68 year old female presents with left upper eyelid swelling and irritation for the past 2 days. Started with slight eyelid irritation and itching 2 days ago. Yesterday eyelid started swelling shut. Experiencing minimal discharge. Has history of ?stye on left upper eyelid and has used warm milk-soaked bread on area in the past with some success. Today swelling increased and tried the milk-bread technique and applying ice with no relief. Denies any fever, nasal congestion, sore throat or cough. Also has history of "double vision" and requests recommendation for local eye doctor. Other chronic health issues include HTN, hyperlipidemia and GERD and currently on Metoprolol, Losartan, Zocor and Fenofibrate daily. Has multiple allergies and intolerance to medications and foods.   The history is provided by the patient.    Past Medical History:  Diagnosis Date  . Anxiety   . Arthritis   . Bladder cancer ([place])   . Cancer ([place]) 2016   bladder ca  . GERD (gastroesophageal reflux disease)   . Gout   . High cholesterol   . History of kidney stones   . Hyperlipidemia   . Hypertension   . Stomach ulcer     Patient Active Problem List   Diagnosis Date Noted  . Kidney stones 08/11/2017  . Bladder cancer ([place]) 08/11/2017  . Osteoarthritis 08/11/2017  . Seasonal allergies 08/11/2017  . Prediabetes 06/21/2017  . AKI (acute kidney injury) ([place]) 03/08/2017  . Left ureteral stone 03/08/2017  . Pharyngoesophageal dysphagia 09/16/2014  . Gastro-esophageal reflux disease without esophagitis 03/04/2014  . Increased frequency of urination 03/20/2012  . Lesion of bladder 03/20/2012  . Urinary urgency 03/20/2012  . Essential hypertension 06/16/2011  . Hyperlipidemia  06/16/2011    Past Surgical History:  Procedure Laterality Date  . ABDOMINAL HYSTERECTOMY    . BLADDER ASPIRATION    . BLADDER SURGERY  1999  . EYE SURGERY    . GALLBLADDER SURGERY    . HEMORROIDECTOMY    . HERNIA REPAIR    . KIDNEY STONE SURGERY    . TUMOR REMOVAL      OB History   No obstetric history on file.      Home Medications    Prior to Admission medications   Medication Sig Start Date End Date Taking? Authorizing Provider  fenofibrate micronized (LOFIBRA) 134 MG capsule Take 134 mg by mouth daily before breakfast.   Yes [provider]  losartan (COZAAR) 100 MG tablet Take 100 mg by mouth daily.   Yes [provider]  metoprolol tartrate (LOPRESSOR) 50 MG tablet Take 50 mg by mouth 2 (two) times daily.   Yes [provider]  Multiple Vitamin (MULTIVITAMIN) capsule Take by mouth.   Yes [provider]  prenatal vitamin w/FE, FA (NATACHEW) 29-1 MG CHEW chewable tablet Chew 1 tablet by mouth daily at 12 noon.   Yes [provider]  simvastatin (ZOCOR) 80 MG tablet Take 80 mg by mouth daily.   Yes [provider]  erythromycin ophthalmic ointment Place a 1/2 inch ribbon of ointment into the lower eyelid 3 times a day for at least 5 days. 03/11/18   Katy Apo, NP    Family History Family History  Problem  Relation Age of Onset  . Hypertension Mother   . Heart failure Father   . Prostate cancer Maternal Grandfather   . Breast cancer Neg Hx     Social History Social History   Tobacco Use  . Smoking status: Former Research scientist (life sciences)  . Smokeless tobacco: Never Used  . Tobacco comment: social smoker 25 years ago  Substance Use Topics  . Alcohol use: Never    Frequency: Never  . Drug use: Never     Allergies   Aspirin; Carbinoxamine; Cetirizine; Coffea arabica; Other; Shellfish allergy; Sulfasalazine; Grapefruit extract; Okra; Penicillins; Sulfa antibiotics; and Pseudoephedrine   Review of Systems Review of  Systems  Constitutional: Negative for activity change, appetite change, chills, fatigue and fever.  HENT: Positive for facial swelling (around left eye) and postnasal drip. Negative for congestion, ear discharge, ear pain, nosebleeds, rhinorrhea, sinus pressure, sinus pain, sneezing, sore throat and trouble swallowing.   Eyes: Positive for redness, itching and visual disturbance (chronic). Negative for photophobia, pain and discharge.  Respiratory: Negative for cough, chest tightness, shortness of breath and wheezing.   Gastrointestinal: Negative for abdominal pain, diarrhea, nausea and vomiting.  Musculoskeletal: Positive for arthralgias. Negative for myalgias, neck pain and neck stiffness.  Skin: Negative for color change, rash and wound.  Allergic/Immunologic: Positive for environmental allergies and food allergies.  Neurological: Negative for dizziness, tremors, seizures, syncope, weakness, light-headedness, numbness and headaches.  Hematological: Negative for adenopathy. Does not bruise/bleed easily.     Physical Exam Triage Vital Signs ED Triage Vitals  Enc Vitals Group     BP 03/11/18 0904 (!) 145/70     Pulse Rate 03/11/18 0904 62     Resp 03/11/18 0904 16     Temp 03/11/18 0904 97.6 F (36.4 C)     Temp Source 03/11/18 0904 Oral     SpO2 03/11/18 0904 100 %     Weight 03/11/18 0903 190 lb (86.2 kg)     Height 03/11/18 0903 5' 8.5" (1.74 m)     Head Circumference --      Peak Flow --      Pain Score 03/11/18 0903 0     Pain Loc --      Pain Edu? --      Excl. in [place]? --    No data found.  Updated Vital Signs BP (!) 145/70 (BP Location: Left Arm)   Pulse 62   Temp 97.6 F (36.4 C) (Oral)   Resp 16   Ht 5' 8.5" (1.74 m)   Wt 190 lb (86.2 kg)   SpO2 100%   BMI 28.47 kg/m   Visual Acuity Right Eye Distance: 20/30 Left Eye Distance: 20/40 Bilateral Distance: 20/30  Right Eye Near:   Left Eye Near:    Bilateral Near:     Physical Exam Vitals signs and  nursing note reviewed.  Constitutional:      General: She is not in acute distress.    Appearance: She is well-developed and well-groomed. She is not ill-appearing.     Comments: Patient sitting quietly in exam chair in no acute distress but left eyelid almost completely swollen closed.   HENT:     Head: Normocephalic. No abrasion, contusion, right periorbital erythema or left periorbital erythema.      Right Ear: Hearing, tympanic membrane, ear canal and external ear normal.     Left Ear: Hearing, tympanic membrane, ear canal and external ear normal.     Nose: Nose normal.     Right  Sinus: No maxillary sinus tenderness or frontal sinus tenderness.     Left Sinus: No maxillary sinus tenderness or frontal sinus tenderness.     Mouth/Throat:     Lips: Pink.     Mouth: Mucous membranes are moist.     Dentition: Has dentures.     Pharynx: Oropharynx is clear. Uvula midline. No oropharyngeal exudate or posterior oropharyngeal erythema.  Eyes:     General: Vision grossly intact. Gaze aligned appropriately.        Right eye: No discharge or hordeolum.        Left eye: Hordeolum present.No discharge.     Extraocular Movements: Extraocular movements intact.     Conjunctiva/sclera:     Left eye: Left conjunctiva is injected. Chemosis present. No exudate.    Pupils: Pupils are equal, round, and reactive to light.     Funduscopic exam:    Right eye: Red reflex present.        Left eye: Red reflex present.     Comments: Left upper lateral aspect of eyelid very swollen with hard probably stye present underneath eyelid- difficult to exam. Tender and warm. No distinct discharge. Conjunctiva mildly injected. Has normal ROM.   Neck:     Musculoskeletal: Normal range of motion and neck supple. No muscular tenderness.  Cardiovascular:     Rate and Rhythm: Normal rate.  Pulmonary:     Effort: Pulmonary effort is normal.  Lymphadenopathy:     Cervical: No cervical adenopathy.  Skin:    General:  Skin is warm and dry.     Capillary Refill: Capillary refill takes less than 2 seconds.     Findings: No rash.  Neurological:     General: No focal deficit present.     Mental Status: She is alert and oriented to person, place, and time.  Psychiatric:        Mood and Affect: Mood normal.        Behavior: Behavior normal. Behavior is cooperative.      UC Treatments / Results  Labs (all labs ordered are listed, but only abnormal results are displayed) Labs Reviewed - No data to display  EKG None  Radiology No results found.  Procedures Procedures (including critical care time)  Medications Ordered in UC Medications - No data to display  Initial Impression / Assessment and Plan / UC Course  I have reviewed the triage vital signs and the nursing notes.  Pertinent labs & imaging results that were available during my care of the patient were reviewed by me and considered in my medical decision making (see chart for details).    Discussed with patient that she appears to have a stye present on left upper eyelid. Discussed probably not pink eye. Due to sensitivity and reactions to many oral medications including 2 antibiotics, will trial Erythromycin ointment- apply 3 times a day as directed for at least 5 days. Apply warm compresses to eye lid to help with comfort and with any drainage. Information provided regarding [facility]. Call tomorrow to schedule appointment ASAP for follow-up and for further evaluation of chronic vision issues.  Final Clinical Impressions(s) / UC Diagnoses   Final diagnoses:  Swelling of left eyelid  Hordeolum externum of left upper eyelid     Discharge Instructions     Recommend apply Erythromycin ointment in left lower eyelid 3 times a day for at least the next 5 days. May apply warm compresses to area for comfort and to help with  any drainage. Recommend call [facility] tomorrow to schedule appointment for follow-up and to be seen  for history of vision issues.     ED Prescriptions    Medication Sig Dispense Auth. Provider   erythromycin ophthalmic ointment Place a 1/2 inch ribbon of ointment into the lower eyelid 3 times a day for at least 5 days. 3.5 g Katy Apo, NP     Controlled Substance Prescriptions [place] Controlled Substance Registry consulted? Not Applicable   Katy Apo, NP 03/11/18 2041

## 2018-04-12 ENCOUNTER — Other Ambulatory Visit: Payer: Self-pay

## 2018-04-12 DIAGNOSIS — N200 Calculus of kidney: Secondary | ICD-10-CM

## 2018-04-13 ENCOUNTER — Other Ambulatory Visit: Payer: Medicare Other | Admitting: Urology

## 2018-05-02 ENCOUNTER — Other Ambulatory Visit: Payer: Self-pay | Admitting: Family Medicine

## 2018-05-02 DIAGNOSIS — Z780 Asymptomatic menopausal state: Secondary | ICD-10-CM

## 2018-05-04 ENCOUNTER — Other Ambulatory Visit
Admission: RE | Admit: 2018-05-04 | Discharge: 2018-05-04 | Disposition: A | Discharge status: Home / Self Care | Payer: Medicare Other | Attending: Urology | Admitting: Urology

## 2018-05-04 ENCOUNTER — Ambulatory Visit: Payer: Medicare Other | Admitting: Urology

## 2018-05-04 ENCOUNTER — Encounter: Payer: Self-pay | Admitting: Urology

## 2018-05-04 VITALS — BP 130/65 | HR 70

## 2018-05-04 DIAGNOSIS — N329 Bladder disorder, unspecified: Secondary | ICD-10-CM

## 2018-05-04 DIAGNOSIS — N200 Calculus of kidney: Secondary | ICD-10-CM | POA: Diagnosis present

## 2018-05-04 LAB — URINALYSIS, COMPLETE (UACMP) WITH MICROSCOPIC
Bilirubin Urine: NEGATIVE
GLUCOSE, UA: NEGATIVE mg/dL
Hgb urine dipstick: NEGATIVE
KETONES UR: NEGATIVE mg/dL
Nitrite: NEGATIVE
PROTEIN: 30 mg/dL — AB
Specific Gravity, Urine: 1.015 (ref 1.005–1.030)
pH: 7.5 (ref 5.0–8.0)

## 2018-05-04 NOTE — Progress Notes (Signed)
   05/04/18  CC:  Chief Complaint  Patient presents with  . Cysto    HPI: 68 year old female who presents today for cystoscopy.  She notes today that she was admitted for anemia and had a full GI work-up.  She is questioning whether  her asymptomatic nonobstructing kidney stone is contributing to her anemia.  Denies any urinary symptoms today including urinary frequency or urgency.  No flank pain.  She does also have a stone measuring 9 mm in the right kidney.  She does have a personal history of TURBT for bladder mass in 2014 consistent with cystitis glandularis, dystrophic calcification and squamous metaplasia.  There is no evidence of atypia.  She had been followed by her urologist annually for cystoscopy and is requesting this again today.   Blood pressure 130/65, pulse 70. NED. A&Ox3.   No respiratory distress   Abd soft, NT, ND Normal external genitalia with patent urethral meatus.  Pelvic organ prolapse appreciated.  Cystoscopy Procedure Note  Patient identification was confirmed, informed consent was obtained, and patient was prepped using Betadine solution.  Lidocaine jelly was administered per urethral meatus.    Procedure: - Flexible cystoscope introduced, without any difficulty.   - Thorough search of the bladder revealed:    normal urethral meatus    normal urothelium with some slight erythema on the posterior inferior bladder wall with some texture but without any obvious papillary changes masses or suspicious lesions.  This is consistent with her history of cystitis glandularis.    no stones    no ulcers     no tumors    no urethral polyps    no trabeculation  - Ureteral orifices were normal in position and appearance.  Post-Procedure: - Patient tolerated the procedure well  Assessment/ Plan:  1. Kidney stones Personal history of 9 mm stone, asymptomatic.  She would like to continue to follow this conservatively. KUB in 1 year - DG Abd 1 View;  Future  2. Lesion of bladder Personal history of cystitis glandularis and squamous metaplasia Cystoscopy today with findings consistent with this We will send cytology as precaution, other pathology not identified today Given her lack of previous malignancy or premalignant lesions, I do not feel that she needs continued annual cystoscopy this point in time Patient is agreeable this plan    Return in about 1 year (around 05/05/2019) for KUB.  Hollice Espy, MD

## 2018-05-09 ENCOUNTER — Other Ambulatory Visit: Payer: Self-pay | Admitting: Urology

## 2018-08-21 ENCOUNTER — Other Ambulatory Visit: Payer: Self-pay | Admitting: Family Medicine

## 2018-08-21 DIAGNOSIS — Z1231 Encounter for screening mammogram for malignant neoplasm of breast: Secondary | ICD-10-CM

## 2018-08-29 ENCOUNTER — Ambulatory Visit
Admission: RE | Admit: 2018-08-29 | Discharge: 2018-08-29 | Disposition: A | Discharge status: Home / Self Care | Payer: Medicare Other | Source: Ambulatory Visit | Attending: Family Medicine | Admitting: Family Medicine

## 2018-08-29 ENCOUNTER — Other Ambulatory Visit: Payer: Self-pay

## 2018-08-29 DIAGNOSIS — Z780 Asymptomatic menopausal state: Secondary | ICD-10-CM | POA: Diagnosis not present

## 2018-08-29 DIAGNOSIS — Z1231 Encounter for screening mammogram for malignant neoplasm of breast: Secondary | ICD-10-CM

## 2018-09-05 NOTE — Progress Notes (Signed)
[facility]  [address], [address] [place], [place] [address] Phone: [phone]  Fax: [phone]   Clinic Day:  09/10/2018  Referring physician: Alice Reichert, Benay Pike, MD  Chief Complaint: Samantha Blankenship is a 68 y.o. female with anemia who is referred in consultation with Efrain Sella, MD for assessment and management.   HPI: The patient was diagnosed with iron deficiency anemia in 12/2017 by her PCP Mcneil Sober after lab work showed a hemoglobin of 10.9 and a hematocrit of 32.8.  She was in the hospital for a week for high fevers related to a kidney stone, after which she was diagnosed with anemia.   EGD on 04/30/2018 by Dr. Madolyn Frieze revealed grade A esophagitis, 2cm hiatal hernia, and normal examined duodenum. Colonoscopy on 04/30/2018 revealed non-thrombosed external hemorrhoids and internal hemorrhoids that prolapse with straining, but spontaneously regress to the resting position (grade II) found on perianal exam. There was one 42mm polyp in the rectum (tubular adenoma).    She was seen by Dr. Alice Reichert for her anemia on 08/20/2018. She was not found to have overt blood loss from the GI tract.   Bone density scan on 08/29/2018 showed osteopenia with a T-score of -1.4.   Labs followed: 06/21/2017: Hematocrit 37.4, hemoglobin 12.3, MCV 96, WBC 5,800, platelets 255,000. 12/11/2017: Hematocrit 31.2, hemoglobin 10.2, MCV 99, WBC 3,500, platelets 264,000.  Ferritin 174 with an iron saturation 15%, TIBC 306. Folate >45. 04/26/2018: Hematocrit 34.4, hemoglobin 11.1, MCV 98, WBC 3,900, platelets 213,000.  Creatinine 1.1. 05/04/2018: Urinalysis was negative for blood. 08/16/2018: Hematocrit 32.4, hemoglobin 10.8, MCV 99, WBC 3,100, platelets 219,000.  Creatinine 1.1. Albumen 3.7.  Protein 7.7.  B12 558. Ferritin 106.   Symptomatically, she is "doing great." She denies any blood in her urine, blood in her stool, black stools, or vaginal bleeding. She notes  fatigue; she has been taking more afternoon naps. She notes joint pain in both arms, which she attributes to a fall in 11/2017 where she broke 2 toes, 3 bones in her eyes, and 7 bones in her left arm. She fell again 2 weeks later and broke her right arm, elbow, and nose.   She has hyperlipidemia, and has been eating fruits, vegetables, fish, and chicken. She went on the keto diet for 4-5 months that ended it in about April. Three weeks ago, she started eating beef and liver again. She eats lots of beans and potatoes.  She has had several surgeries, and notes when she was 68yo she had "half her stomach and intestines removed due to a large tumor." She reports this was related to a stomach ulcer.  Pathology was "negative for cancer".  Surgery occurred in [place], [place].  She has had a hysterectomy, hernia repair, and cholecystectomy.   She has a history of bladder cancer in 2014. She was in [place], [place] at [facility] and underwent a TURBT.  Pathology revealed cystitis glandularis, dystrophic calcification and squamous metaplasia. There was no evidence of atypia. She has had negative cystoscopies since, most recently on 05/04/2018. She is followed by Dr. Hollice Espy in Urology.   She denies use of any supplements or herbal products. She has a family history of prostate cancer in her grandfather but denies any other family history of blood disorders or cancers.    Past Medical History:  Diagnosis Date  . Anxiety   . Arthritis   . Bladder cancer ([place])   . Cancer ([place]) 2016   bladder ca  .  GERD (gastroesophageal reflux disease)   . Gout   . High cholesterol   . History of kidney stones   . Hyperlipidemia   . Hypertension   . Stomach ulcer     Past Surgical History:  Procedure Laterality Date  . ABDOMINAL HYSTERECTOMY    . BLADDER ASPIRATION    . BLADDER SURGERY  1999  . EYE SURGERY    . GALLBLADDER SURGERY    . HEMORROIDECTOMY    . HERNIA REPAIR    . KIDNEY STONE SURGERY     . TUMOR REMOVAL      Family History  Problem Relation Age of Onset  . Hypertension Mother   . Heart failure Father   . Prostate cancer Maternal Grandfather   . Breast cancer Neg Hx     Social History:  reports that she has quit smoking. She has never used smokeless tobacco. She reports that she does not drink alcohol or use drugs. She denies any known exposure to radiation or toxins. She has a distant history of socially smoking. 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  She lives with her husband in [place].  The patient is alone today.  Allergies:  Allergies  Allergen Reactions  . Aspirin Shortness Of Breath and Swelling  . Carbinoxamine Shortness Of Breath and Swelling  . Cetirizine Shortness Of Breath and Swelling  . Coffea Arabica Shortness Of Breath  . Other Shortness Of Breath, Swelling and Other (See Comments)    Shellfish BLISTERS  . Shellfish Allergy Shortness Of Breath and Swelling  . Sulfasalazine Palpitations, Rash and Swelling  . Grapefruit Extract Swelling  . Okra Swelling  . Penicillins   . Sulfa Antibiotics   . Pseudoephedrine Palpitations    Current Medications: Current Outpatient Medications  Medication Sig Dispense Refill  . albuterol (VENTOLIN HFA) 108 (90 Base) MCG/ACT inhaler Inhale 1-2 puffs into the lungs every 4 (four) hours as needed.     Marland Kitchen amLODipine (NORVASC) 5 MG tablet Take 5 mg by mouth daily.     . B Complex Vitamins (VITAMIN B-COMPLEX) TABS Take 1 tablet by mouth daily.     . Cholecalciferol (VITAMIN D3) 250 MCG (10000 UT) TABS Take 1 tablet by mouth 2 (two) times a day.    . fenofibrate micronized (LOFIBRA) 134 MG capsule Take 134 mg by mouth daily before breakfast.    . losartan (COZAAR) 100 MG tablet Take 100 mg by mouth daily.    . metoprolol tartrate (LOPRESSOR) 50 MG tablet Take 50 mg by mouth 2 (two) times daily.    . prenatal vitamin w/FE, FA (NATACHEW) 29-1 MG CHEW chewable tablet Chew 1 tablet by  mouth daily at 12 noon.    . simvastatin (ZOCOR) 80 MG tablet Take 80 mg by mouth daily.     No current facility-administered medications for this visit.     Review of Systems  Constitutional: Positive for malaise/fatigue. Negative for chills, diaphoresis, fever and weight loss.       Feels "great".  Some fatigue, takes afternoon naps.  HENT: Negative.  Negative for congestion, ear pain, hearing loss, nosebleeds, sinus pain and sore throat.   Eyes: Negative.  Negative for blurred vision, double vision, photophobia and pain.  Respiratory: Negative.  Negative for cough, shortness of breath and wheezing.   Cardiovascular: Negative.  Negative for chest pain, palpitations, orthopnea, leg swelling and PND.  Gastrointestinal: Negative.  Negative for abdominal pain, blood in stool, constipation, diarrhea, melena, nausea and vomiting.  Genitourinary: Negative.  Negative for dysuria, frequency, hematuria and urgency.  Musculoskeletal: Positive for joint pain (bilateral arm pain due to falls in 11/2017). Negative for back pain and myalgias.  Skin: Negative.  Negative for rash.  Neurological: Negative.  Negative for dizziness, tingling, speech change, focal weakness, weakness and headaches.  Endo/Heme/Allergies: Negative.  Does not bruise/bleed easily.  Psychiatric/Behavioral: Negative.  Negative for depression, memory loss and substance abuse. The patient is not nervous/anxious and does not have insomnia.   All other systems reviewed and are negative.  Performance status (ECOG): 1  Blood pressure (!) 149/77, pulse 60, temperature (!) 97 F (36.1 C), temperature source Tympanic, resp. rate 16, height 5' 7.5" (1.715 m), weight 191 lb 11 oz (87 kg), SpO2 100 %.   Physical Exam  Constitutional: She is oriented to person, place, and time. She appears well-developed and well-nourished. No distress.  HENT:  Head: Normocephalic and atraumatic.  Mouth/Throat: Oropharynx is clear and moist. No  oropharyngeal exudate.  Wearing a mask.  Short gray hair.  Dentures.  Eyes: Pupils are equal, round, and reactive to light. Conjunctivae and EOM are normal. No scleral icterus.  Dark glasses.  Hazel eyes.  Neck: Normal range of motion. Neck supple. No JVD present.  Cardiovascular: Normal rate, regular rhythm and normal heart sounds.  No murmur heard. Pulmonary/Chest: Effort normal and breath sounds normal. No respiratory distress. She has no wheezes.  Abdominal: Soft. Bowel sounds are normal. She exhibits no distension and no mass. There is no abdominal tenderness. There is no rebound and no guarding.  Musculoskeletal: Normal range of motion.        General: No tenderness or edema.  Lymphadenopathy:    She has no cervical adenopathy.    She has no axillary adenopathy.       Right: No inguinal and no supraclavicular adenopathy present.       Left: No inguinal and no supraclavicular adenopathy present.  Neurological: She is alert and oriented to person, place, and time.  Skin: Skin is warm and dry. No rash noted. She is not diaphoretic. No erythema. No pallor.  Psychiatric: She has a normal mood and affect. Her behavior is normal. Judgment and thought content normal.  Nursing note and vitals reviewed.   No visits with results within 3 Day(s) from this visit.  Latest known visit with results is:  Hospital Outpatient Visit on 05/04/2018  Component Date Value Ref Range Status  . Color, Urine 05/04/2018 STRAW* YELLOW Final  . APPearance 05/04/2018 CLEAR  CLEAR Final  . Specific Gravity, Urine 05/04/2018 1.015  1.005 - 1.030 Final  . pH 05/04/2018 7.5  5.0 - 8.0 Final  . Glucose, UA 05/04/2018 NEGATIVE  NEGATIVE mg/dL Final  . Hgb urine dipstick 05/04/2018 NEGATIVE  NEGATIVE Final  . Bilirubin Urine 05/04/2018 NEGATIVE  NEGATIVE Final  . Ketones, ur 05/04/2018 NEGATIVE  NEGATIVE mg/dL Final  . Protein, ur 05/04/2018 30* NEGATIVE mg/dL Final  . Nitrite 05/04/2018 NEGATIVE  NEGATIVE Final   . Leukocytes,Ua 05/04/2018 MODERATE* NEGATIVE Final  . Squamous Epithelial / LPF 05/04/2018 6-10  0 - 5 Final  . WBC, UA 05/04/2018 11-20  0 - 5 WBC/hpf Final  . RBC / HPF 05/04/2018 0-5  0 - 5 RBC/hpf Final  . Bacteria, UA 05/04/2018 MANY* NONE SEEN Final   Performed at [facility], [address], [place], [place] [address]    Assessment:  Samantha Blankenship is a 68 y.o. female with a normocytic anemia since 12/07/2017.  Hemoglobin has ranged between 10.8 - 11.1.  MCV 98-99.  Diet appears good.  She denies any bleeding.  Ferritin has been followed: 174 on 12/11/2017 and 106 on 08/16/2018.  Iron saturation was 15% on 10/07/23019.  Folate was > 45 on 12/11/2017.  B12 was 558 on 08/16/2018.  Creatinine was 1.1.  EGD on revealed grade A esophagitis, 2cm hiatal hernia, and normal examined duodenum.  Colonoscopy on 04/30/2018 revealed non-thrombosed external hemorrhoids and internal hemorrhoids that prolapse with straining, but regress to the resting position (grade II). There was one 29mm polyp in the rectum (tubular adenoma).    Symptomatically, she feels "great", but has fatigue and takes a nap in the afternoons.  She denies any B symptoms.  Exam reveals no adenopathy or hepatosplenomegaly.  Plan: 1.   Labs today:  CBC with diff, retic, ferritin, iron studies, sed rate, folate, TSH, haptoglobin, SPEP, FLCA.  2.   Normocytic anemia  Etiology unclear.    Iron stores, B12, and folate have been normal in the past.  Discuss additional work-up. 3.   RTC in 1 week for MD assessment and review of work-up.  I discussed the assessment and treatment plan with the patient.  The patient was provided an opportunity to ask questions and all were answered.  The patient agreed with the plan and demonstrated an understanding of the instructions.  The patient was advised to call back if the symptoms worsen or if the condition fails to improve as anticipated.  I provided 35 minutes of face-to-face  time during this this encounter and > 50% was spent counseling as documented under my assessment and plan.    Melissa C. Mike Gip, MD, PhD    09/10/2018, 10:48 AM  I, Molly Dorshimer, am acting as Education administrator for [employer]. Mike Gip, MD, PhD.  I, Melissa C. Mike Gip, MD, have reviewed the above documentation for accuracy and completeness, and I agree with the above.

## 2018-09-08 DIAGNOSIS — D649 Anemia, unspecified: Secondary | ICD-10-CM | POA: Insufficient documentation

## 2018-09-10 ENCOUNTER — Inpatient Hospital Stay: Payer: Medicare Other | Attending: Hematology and Oncology | Admitting: Hematology and Oncology

## 2018-09-10 ENCOUNTER — Other Ambulatory Visit: Payer: Self-pay

## 2018-09-10 ENCOUNTER — Encounter: Payer: Self-pay | Admitting: Hematology and Oncology

## 2018-09-10 DIAGNOSIS — I10 Essential (primary) hypertension: Secondary | ICD-10-CM | POA: Diagnosis not present

## 2018-09-10 DIAGNOSIS — D649 Anemia, unspecified: Secondary | ICD-10-CM | POA: Diagnosis not present

## 2018-09-10 DIAGNOSIS — Z8551 Personal history of malignant neoplasm of bladder: Secondary | ICD-10-CM | POA: Diagnosis not present

## 2018-09-10 DIAGNOSIS — Z79899 Other long term (current) drug therapy: Secondary | ICD-10-CM | POA: Insufficient documentation

## 2018-09-10 DIAGNOSIS — D472 Monoclonal gammopathy: Secondary | ICD-10-CM | POA: Diagnosis not present

## 2018-09-10 DIAGNOSIS — Z87891 Personal history of nicotine dependence: Secondary | ICD-10-CM | POA: Insufficient documentation

## 2018-09-10 DIAGNOSIS — D509 Iron deficiency anemia, unspecified: Secondary | ICD-10-CM | POA: Diagnosis present

## 2018-09-10 LAB — RETICULOCYTES
Immature Retic Fract: 18.7 % — ABNORMAL HIGH (ref 2.3–15.9)
RBC.: 3.63 MIL/uL — ABNORMAL LOW (ref 3.87–5.11)
Retic Count, Absolute: 53.4 10*3/uL (ref 19.0–186.0)
Retic Ct Pct: 1.5 % (ref 0.4–3.1)

## 2018-09-10 LAB — IRON AND TIBC
Iron: 63 ug/dL (ref 28–170)
Saturation Ratios: 18 % (ref 10.4–31.8)
TIBC: 360 ug/dL (ref 250–450)
UIBC: 297 ug/dL

## 2018-09-10 LAB — CBC WITH DIFFERENTIAL/PLATELET
Abs Immature Granulocytes: 0.02 10*3/uL (ref 0.00–0.07)
Basophils Absolute: 0 10*3/uL (ref 0.0–0.1)
Basophils Relative: 0 %
Eosinophils Absolute: 0.1 10*3/uL (ref 0.0–0.5)
Eosinophils Relative: 3 %
HCT: 36.3 % (ref 36.0–46.0)
Hemoglobin: 12 g/dL (ref 12.0–15.0)
Immature Granulocytes: 0 %
Lymphocytes Relative: 31 %
Lymphs Abs: 1.4 10*3/uL (ref 0.7–4.0)
MCH: 32.5 pg (ref 26.0–34.0)
MCHC: 33.1 g/dL (ref 30.0–36.0)
MCV: 98.4 fL (ref 80.0–100.0)
Monocytes Absolute: 0.5 10*3/uL (ref 0.1–1.0)
Monocytes Relative: 11 %
Neutro Abs: 2.5 10*3/uL (ref 1.7–7.7)
Neutrophils Relative %: 55 %
Platelets: 217 10*3/uL (ref 150–400)
RBC: 3.69 MIL/uL — ABNORMAL LOW (ref 3.87–5.11)
RDW: 14.7 % (ref 11.5–15.5)
WBC: 4.6 10*3/uL (ref 4.0–10.5)
nRBC: 0 % (ref 0.0–0.2)

## 2018-09-10 LAB — FERRITIN: Ferritin: 129 ng/mL (ref 11–307)

## 2018-09-10 LAB — LACTATE DEHYDROGENASE: LDH: 120 U/L (ref 98–192)

## 2018-09-10 LAB — SEDIMENTATION RATE: Sed Rate: 86 mm/hr — ABNORMAL HIGH (ref 0–30)

## 2018-09-10 LAB — TSH: TSH: 1.562 u[IU]/mL (ref 0.350–4.500)

## 2018-09-10 LAB — FOLATE: Folate: 28 ng/mL (ref >5.9)

## 2018-09-10 NOTE — Progress Notes (Signed)
Pt here as new patient referral from Dr. Alice Reichert for [facility]. Denies any concerns.

## 2018-09-11 LAB — KAPPA/LAMBDA LIGHT CHAINS
Kappa free light chain: 15.7 mg/L (ref 3.3–19.4)
Kappa, lambda light chain ratio: 0.01 — ABNORMAL LOW (ref 0.26–1.65)
Lambda free light chains: 1908.9 mg/L — ABNORMAL HIGH (ref 5.7–26.3)

## 2018-09-11 LAB — HAPTOGLOBIN: Haptoglobin: 121 mg/dL (ref 37–355)

## 2018-09-12 LAB — PROTEIN ELECTROPHORESIS, SERUM
A/G Ratio: 1 (ref 0.7–1.7)
Albumin ELP: 4 g/dL (ref 2.9–4.4)
Alpha-1-Globulin: 0.2 g/dL (ref 0.0–0.4)
Alpha-2-Globulin: 0.6 g/dL (ref 0.4–1.0)
Beta Globulin: 1.2 g/dL (ref 0.7–1.3)
Gamma Globulin: 2.1 g/dL — ABNORMAL HIGH (ref 0.4–1.8)
Globulin, Total: 4.2 g/dL — ABNORMAL HIGH (ref 2.2–3.9)
M-Spike, %: 1.4 g/dL — ABNORMAL HIGH
Total Protein ELP: 8.2 g/dL (ref 6.0–8.5)

## 2018-09-14 NOTE — Progress Notes (Signed)
[facility]  [address], [address] [place], [place] [address] Phone: [phone]  Fax: [phone]   Clinic Day:  09/17/2018  Referring physician: Zeb Comfort, MD  Chief Complaint: Samantha Blankenship is a 68 y.o. female with anemia and monoclonal gammopathy who is seen for 1 week assessment to review interval work-up.   HPI: The patient was last seen in the hematology clinic on 09/10/2018 for initial consultation. She was noted to have a normocytic anemia since 12/07/2017.  Hemoglobin had ranged between 10.8 - 11.1.  MCV 98-99.  Diet appears good.  She denied any bleeding. She described fatigue and needing nap in the afternoon.  Work-up on 09/10/2018 showed: WBC 4,600, hemoglobin 12.0, hematocrit 36.3, platelets 217,000. Iron saturation was 18%.  Ferritin 129.  Retic was 1.5%. Sed rate 86. LDH 120. Folate 28.0. TSH 1.562. Haptoglobin 121. SPEP demonstrated a 1.4 gm/dL M-spike in the gamma region.  Kappa free light chains were 15.7, lambda free light chain 1098.9, and ratio 0.01 (0.26-1.65).   During the interim, she reports her fatigue has improved, which she attributes to her temperature being around 97-98 as opposed to 95-96 previously. She has been juicing and eating green leafy vegetables daily in addition to meat twice daily.   She is very motivated to undergo a bone marrow and PET scan.   Past Medical History:  Diagnosis Date  . Anxiety   . Arthritis   . Bladder cancer ([place])   . Cancer ([place]) 2016   bladder ca  . GERD (gastroesophageal reflux disease)   . Gout   . High cholesterol   . History of kidney stones   . Hyperlipidemia   . Hypertension   . Stomach ulcer     Past Surgical History:  Procedure Laterality Date  . ABDOMINAL HYSTERECTOMY    . BLADDER ASPIRATION    . BLADDER SURGERY  1999  . EYE SURGERY    . GALLBLADDER SURGERY    . HEMORROIDECTOMY    . HERNIA REPAIR    . KIDNEY STONE SURGERY    . TUMOR REMOVAL      Family  History  Problem Relation Age of Onset  . Hypertension Mother   . Heart failure Father   . Prostate cancer Maternal Grandfather   . Breast cancer Neg Hx     Social History:  reports that she has quit smoking. She has never used smokeless tobacco. She reports that she does not drink alcohol or use drugs. She denies any known exposure to radiation or toxins. She has a distant history of socially smoking. 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  She lives with her husband in [place].  The patient is alone today.  Allergies:  Allergies  Allergen Reactions  . Aspirin Shortness Of Breath and Swelling  . Carbinoxamine Shortness Of Breath and Swelling  . Cetirizine Shortness Of Breath and Swelling  . Coffea Arabica Shortness Of Breath  . Other Shortness Of Breath, Swelling and Other (See Comments)    Shellfish BLISTERS  . Shellfish Allergy Shortness Of Breath and Swelling  . Sulfasalazine Palpitations, Rash and Swelling  . Grapefruit Extract Swelling  . Okra Swelling  . Penicillins   . Sulfa Antibiotics   . Pseudoephedrine Palpitations    Current Medications: Current Outpatient Medications  Medication Sig Dispense Refill  . amLODipine (NORVASC) 5 MG tablet Take 5 mg by mouth daily.     . B Complex Vitamins (VITAMIN B-COMPLEX) TABS Take 1  tablet by mouth daily.     . Cholecalciferol (VITAMIN D3) 250 MCG (10000 UT) TABS Take 1 tablet by mouth 2 (two) times a day.    . fenofibrate micronized (LOFIBRA) 134 MG capsule Take 134 mg by mouth daily before breakfast.    . folic acid (FOLVITE) 1 MG tablet Take 1 mg by mouth daily.    Marland Kitchen losartan (COZAAR) 100 MG tablet Take 100 mg by mouth daily.    . metoprolol tartrate (LOPRESSOR) 50 MG tablet Take 50 mg by mouth 2 (two) times daily.    . prenatal vitamin w/FE, FA (NATACHEW) 29-1 MG CHEW chewable tablet Chew 1 tablet by mouth daily at 12 noon.    . simvastatin (ZOCOR) 80 MG tablet Take 80 mg by mouth daily.    Marland Kitchen  albuterol (VENTOLIN HFA) 108 (90 Base) MCG/ACT inhaler Inhale 1-2 puffs into the lungs every 4 (four) hours as needed.      No current facility-administered medications for this visit.     Review of Systems  Constitutional: Positive for malaise/fatigue (improving). Negative for chills, diaphoresis, fever and weight loss.  HENT: Negative.  Negative for congestion, hearing loss, sinus pain and sore throat.   Eyes: Negative.  Negative for blurred vision.  Respiratory: Negative.  Negative for cough, shortness of breath and wheezing.   Cardiovascular: Negative for chest pain, palpitations, orthopnea, leg swelling and PND.  Gastrointestinal: Negative.  Negative for abdominal pain, blood in stool, constipation, diarrhea, melena, nausea and vomiting.  Genitourinary: Negative.  Negative for dysuria, frequency, hematuria and urgency.  Musculoskeletal: Positive for joint pain (bilateral arm pain due to falls in 11/2017). Negative for back pain and myalgias.  Skin: Negative.  Negative for rash.  Neurological: Negative.  Negative for dizziness, tingling, sensory change, weakness and headaches.  Endo/Heme/Allergies: Negative.  Does not bruise/bleed easily.  Psychiatric/Behavioral: Negative.  Negative for depression, memory loss and substance abuse. The patient is not nervous/anxious and does not have insomnia.   All other systems reviewed and are negative.  Performance status (ECOG): 1  Vitals Blood pressure (!) 149/76, pulse 63, temperature (!) 97.4 F (36.3 C), temperature source Tympanic, resp. rate 18, height 5' 7.5" (1.715 m), weight 191 lb 14.6 oz (87.1 kg), SpO2 100 %.   Physical Exam  Constitutional: She appears well-developed and well-nourished. No distress.  HENT:  Head: Normocephalic and atraumatic.  Short gray hair. Mask.  Eyes: Conjunctivae and EOM are normal. No scleral icterus.  Skin: She is not diaphoretic.  Psychiatric: She has a normal mood and affect. Her behavior is normal.  Judgment and thought content normal.  Nursing note and vitals reviewed.   No visits with results within 3 Day(s) from this visit.  Latest known visit with results is:  Office Visit on 09/10/2018  Component Date Value Ref Range Status  . LDH 09/10/2018 120  98 - 192 U/L Final   Performed at [facility], [address], [place], [place] [address]  . Haptoglobin 09/10/2018 121  37 - 355 mg/dL Final   Comment: (NOTE) Performed At: [facility] [place], [place] [id number] Rush Farmer MD TY:[id number]   . Kappa free light chain 09/10/2018 15.7  3.3 - 19.4 mg/L Final  . Lamda free light chains 09/10/2018 1,908.9* 5.7 - 26.3 mg/L Final  . Kappa, lamda light chain ratio 09/10/2018 0.01* 0.26 - 1.65 Final   Comment: (NOTE) Performed At: [facility] [place], [place] [id number] Rush Farmer MD UY:[id number]   .  Total Protein ELP 09/10/2018 8.2  6.0 - 8.5 g/dL Final  . Albumin ELP 09/10/2018 4.0  2.9 - 4.4 g/dL Final  . Alpha-1-Globulin 09/10/2018 0.2  0.0 - 0.4 g/dL Final  . Alpha-2-Globulin 09/10/2018 0.6  0.4 - 1.0 g/dL Final  . Beta Globulin 09/10/2018 1.2  0.7 - 1.3 g/dL Final  . Gamma Globulin 09/10/2018 2.1* 0.4 - 1.8 g/dL Final  . M-Spike, % 09/10/2018 1.4* Not Observed g/dL Final  . SPE Interp. 09/10/2018 Comment   Final   Comment: (NOTE) The SPE pattern demonstrates a single peak (M-spike) in the gamma region which may represent monoclonal protein. This peak may also be caused by circulating immune complexes, cryoglobulins, C-reactive protein, fibrinogen or hemolysis.  If clinically indicated, the presence of a monoclonal gammopathy may be confirmed by immuno- fixation, as well as an evaluation of the urine for the presence of Bence-Jones protein. Performed At: [facility] [place], [place] [id number] Rush Farmer MD NO:[id number]   . Comment 09/10/2018 Comment   Final   Comment:  (NOTE) Protein electrophoresis scan will follow via computer, mail, or courier delivery.   . Globulin, Total 09/10/2018 4.2* 2.2 - 3.9 g/dL Corrected  . A/G Ratio 09/10/2018 1.0  0.7 - 1.7 Corrected  . TSH 09/10/2018 1.562  0.350 - 4.500 uIU/mL Final   Comment: Performed by a 3rd Generation assay with a functional sensitivity of <=0.01 uIU/mL. Performed at [facility], [address], [place], [place] [address]   . Folate 09/10/2018 28.0  >5.9 ng/mL Final   Performed at [facility], [address] [place], [place] [address]  . Sed Rate 09/10/2018 86* 0 - 30 mm/hr Final   Performed at [facility], [address] [place], [place] [address]  . Iron 09/10/2018 63  28 - 170 ug/dL Final  . TIBC 09/10/2018 360  250 - 450 ug/dL Final  . Saturation Ratios 09/10/2018 18  10.4 - 31.8 % Final  . UIBC 09/10/2018 297  ug/dL Final   Performed at [facility], [address], [place], [place] [address]  . Ferritin 09/10/2018 129  11 - 307 ng/mL Final   Performed at [facility], [address] [place], [place] [address]  . Retic Ct Pct 09/10/2018 1.5  0.4 - 3.1 % Final  . RBC. 09/10/2018 3.63* 3.87 - 5.11 MIL/uL Final  . Retic Count, Absolute 09/10/2018 53.4  19.0 - 186.0 K/uL Final  . Immature Retic Fract 09/10/2018 18.7* 2.3 - 15.9 % Final   Performed at [facility], [address] [place], [place] [address]  . WBC 09/10/2018 4.6  4.0 - 10.5 K/uL Final  . RBC 09/10/2018 3.69* 3.87 - 5.11 MIL/uL Final  . Hemoglobin 09/10/2018 12.0  12.0 - 15.0 g/dL Final  . HCT 09/10/2018 36.3  36.0 - 46.0 % Final  . MCV 09/10/2018 98.4  80.0 - 100.0 fL Final  . MCH 09/10/2018 32.5  26.0 - 34.0 pg Final  . MCHC 09/10/2018 33.1  30.0 - 36.0 g/dL Final  . RDW 09/10/2018 14.7  11.5 - 15.5 % Final  . Platelets 09/10/2018 217  150 - 400 K/uL Final  . nRBC 09/10/2018 0.0  0.0 - 0.2 % Final  . Neutrophils Relative % 09/10/2018 55  % Final  . Neutro  Abs 09/10/2018 2.5  1.7 - 7.7 K/uL Final  . Lymphocytes Relative 09/10/2018 31  % Final  . Lymphs Abs 09/10/2018 1.4  0.7 - 4.0  K/uL Final  . Monocytes Relative 09/10/2018 11  % Final  . Monocytes Absolute 09/10/2018 0.5  0.1 - 1.0 K/uL Final  . Eosinophils Relative 09/10/2018 3  % Final  . Eosinophils Absolute 09/10/2018 0.1  0.0 - 0.5 K/uL Final  . Basophils Relative 09/10/2018 0  % Final  . Basophils Absolute 09/10/2018 0.0  0.0 - 0.1 K/uL Final  . Immature Granulocytes 09/10/2018 0  % Final  . Abs Immature Granulocytes 09/10/2018 0.02  0.00 - 0.07 K/uL Final   Performed at [facility], [address] [place], [place] [address]    Assessment:  Samantha Blankenship is a 68 y.o. female with a normocytic anemia since 12/07/2017.  Hemoglobin has ranged between 10.8 - 11.1.  MCV 98-99.  Diet appears good.  She denies any bleeding.  Work-up on 09/10/2018 revealed a WBC 4,600, hemoglobin 12.0, hematocrit 36.3, platelets 217,000.  Ferritin was 129 with an iron saturation 18% and TIBC 360.  Sed rate was 86.  Retic was 1.5%.  Normal studies included:  LDH, folate, TSH, and haptoglobin. SPEP demonstrated a 1.4 gm/dL M-spike in the gamma region.  Kappa free light chains were 15.7, lambda free light chains 1098.9, and ratio 0.01 (0.26-1.65).   Ferritin has been followed: 174 on 12/11/2017, 106 on 08/16/2018, and 129 on 09/10/2018.  Iron saturation was 15% on 10/07/[address].  Folate was > 45 on 12/11/2017.  B12 was 558 on 08/16/2018.  Creatinine was 1.1 (0.7 in 07/2011).  EGD on revealed grade A esophagitis, 2cm hiatal hernia, and normal examined duodenum.  Colonoscopy on 04/30/2018 revealed non-thrombosed external hemorrhoids and internal hemorrhoids that prolapse with straining, but regress to the resting position (grade II). There was one 41mm polyp in the rectum (tubular adenoma).    Symptomatically, she has noticed some improvement in her fatigue.  Exam reveals no adenopathy or  hepatosplenomegaly.  Plan: 1.   Labs today: myeloma panel, beta2-microglobulin, PT, PTT, BNP.  2.   24 hour urine for UPEP and free light chains. 3.   Normocytic anemia  Work-up revealed a normal hemoglobin and hematocrit.  However, she has a monoclonal gammopathy and excess free light chains.  Discuss work-up for myeloma.   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  Discuss CRAB criteria:  elevated Calcium, Renal insufficiency, Anemia, Bone lesions.   Creatinine normal, but higher than in 2013.  Hemoglobin and calcium are normal.    Discuss PET scan vs bone survey.     Assess for bone lesions.  Consider bone marrow aspirate and biopsy. 4.   PET scan on 09/21/2018. 5.   Bone marrow aspirate and biopsy 09/25/2018. 6.   RTC 10 days after bone marrow for MD assessment and discussion regarding direction of therapy.     I discussed the assessment and treatment plan with the patient.  The patient was provided an opportunity to ask questions and all were answered.  The patient agreed with the plan and demonstrated an understanding of the instructions.  The patient was advised to call back if the symptoms worsen or if the condition fails to improve as anticipated.  I provided 15 minutes of face-to-face time during this this encounter and > 50% was spent counseling as documented under my assessment and plan.    Lequita Asal, MD, PhD    09/17/2018, 9:25 AM  I, Molly Dorshimer, am acting as Education administrator for [employer]. Mike Gip, MD, PhD.  I,  C. Mike Gip, MD,  have reviewed the above documentation for accuracy and completeness, and I agree with the above.

## 2018-09-17 ENCOUNTER — Inpatient Hospital Stay: Payer: Medicare Other | Admitting: Hematology and Oncology

## 2018-09-17 ENCOUNTER — Encounter: Payer: Self-pay | Admitting: Hematology and Oncology

## 2018-09-17 ENCOUNTER — Other Ambulatory Visit: Payer: Self-pay

## 2018-09-17 ENCOUNTER — Inpatient Hospital Stay: Payer: Medicare Other

## 2018-09-17 VITALS — BP 149/76 | HR 63 | Temp 97.4°F | Resp 18 | Ht 67.5 in | Wt 191.9 lb

## 2018-09-17 DIAGNOSIS — D649 Anemia, unspecified: Secondary | ICD-10-CM

## 2018-09-17 DIAGNOSIS — R768 Other specified abnormal immunological findings in serum: Secondary | ICD-10-CM | POA: Diagnosis not present

## 2018-09-17 DIAGNOSIS — R5383 Other fatigue: Secondary | ICD-10-CM

## 2018-09-17 DIAGNOSIS — D472 Monoclonal gammopathy: Secondary | ICD-10-CM | POA: Diagnosis not present

## 2018-09-17 DIAGNOSIS — D509 Iron deficiency anemia, unspecified: Secondary | ICD-10-CM | POA: Diagnosis not present

## 2018-09-17 DIAGNOSIS — I10 Essential (primary) hypertension: Secondary | ICD-10-CM

## 2018-09-17 LAB — PROTIME-INR
INR: 1 (ref 0.8–1.2)
Prothrombin Time: 12.8 seconds (ref 11.4–15.2)

## 2018-09-17 LAB — APTT: aPTT: 34 seconds (ref 24–36)

## 2018-09-17 LAB — BRAIN NATRIURETIC PEPTIDE: B Natriuretic Peptide: 92 pg/mL (ref 0.0–100.0)

## 2018-09-17 NOTE — Progress Notes (Signed)
No new changes noted today 

## 2018-09-18 DIAGNOSIS — R5383 Other fatigue: Secondary | ICD-10-CM | POA: Insufficient documentation

## 2018-09-18 LAB — MULTIPLE MYELOMA PANEL, SERUM
Albumin SerPl Elph-Mcnc: 4 g/dL (ref 2.9–4.4)
Albumin/Glob SerPl: 1 (ref 0.7–1.7)
Alpha 1: 0.2 g/dL (ref 0.0–0.4)
Alpha2 Glob SerPl Elph-Mcnc: 0.7 g/dL (ref 0.4–1.0)
B-Globulin SerPl Elph-Mcnc: 1.2 g/dL (ref 0.7–1.3)
Gamma Glob SerPl Elph-Mcnc: 2.1 g/dL — ABNORMAL HIGH (ref 0.4–1.8)
Globulin, Total: 4.2 g/dL — ABNORMAL HIGH (ref 2.2–3.9)
IgA: 2960 mg/dL — ABNORMAL HIGH (ref 87–352)
IgG (Immunoglobin G), Serum: 376 mg/dL — ABNORMAL LOW (ref 586–1602)
IgM (Immunoglobulin M), Srm: 37 mg/dL (ref 26–217)
M Protein SerPl Elph-Mcnc: 1.6 g/dL — ABNORMAL HIGH
Total Protein ELP: 8.2 g/dL (ref 6.0–8.5)

## 2018-09-20 ENCOUNTER — Other Ambulatory Visit: Payer: Self-pay

## 2018-09-20 DIAGNOSIS — R768 Other specified abnormal immunological findings in serum: Secondary | ICD-10-CM

## 2018-09-20 DIAGNOSIS — D472 Monoclonal gammopathy: Secondary | ICD-10-CM

## 2018-09-20 DIAGNOSIS — D509 Iron deficiency anemia, unspecified: Secondary | ICD-10-CM | POA: Diagnosis not present

## 2018-09-21 LAB — FREE K+L LT CHAINS,QN,UR
Free Kappa Lt Chains,Ur: 8.41 mg/L (ref 0.63–113.79)
Free Kappa/Lambda Ratio: 0.01 — ABNORMAL LOW (ref 1.03–31.76)
Free Lambda Lt Chains,Ur: 798.9 mg/L — ABNORMAL HIGH (ref 0.47–11.77)
Total Volume: 2000

## 2018-09-24 LAB — IFE+PROTEIN ELECTRO, 24-HR UR
% BETA, Urine: 62.2 %
ALPHA 1 URINE: 1.8 %
Albumin, U: 29.2 %
Alpha 2, Urine: 4.4 %
GAMMA GLOBULIN URINE: 2.4 %
M-SPIKE %, Urine: 54.8 % — ABNORMAL HIGH
M-Spike, Mg/24 Hr: 287 mg/24 hr — ABNORMAL HIGH
Total Protein, Urine-Ur/day: 524 mg/24 hr — ABNORMAL HIGH (ref 30–150)
Total Protein, Urine: 26.2 mg/dL
Total Volume: 2000

## 2018-09-25 ENCOUNTER — Ambulatory Visit: Payer: Medicare Other

## 2018-09-25 ENCOUNTER — Encounter: Payer: Self-pay | Admitting: Hematology and Oncology

## 2018-10-04 ENCOUNTER — Telehealth: Payer: Self-pay

## 2018-10-04 NOTE — Telephone Encounter (Signed)
Inbound call from patient stating she contacted her insurance company and was informed nothing was received to get authorization for PET. Informed patient Margreta Journey was working to get authorization on this and we would inform her of more information by Monday, 8/3. Patient verbalizes understanding and denies any further questions or concerns.

## 2018-10-10 ENCOUNTER — Telehealth: Payer: Self-pay

## 2018-10-10 NOTE — Telephone Encounter (Signed)
Spoke with the patient to inform her that her PET scan was denied again. The patient appears to be very upset about the denied PET scan and states this shouldn't  be happen and states she going to call the insurance company her self. The patient was not understand or agreeable.

## 2018-10-10 NOTE — Telephone Encounter (Signed)
-----   Message from Loa Socks sent at 10/10/2018  3:33 PM EDT ----- Regarding: PET Denied Per [facility] Medicare PET request denied. Please advise.  TY

## 2018-10-16 ENCOUNTER — Telehealth: Payer: Self-pay

## 2018-10-16 ENCOUNTER — Telehealth: Payer: Self-pay | Admitting: Hematology and Oncology

## 2018-10-16 NOTE — Telephone Encounter (Signed)
-----   Message from Lequita Asal, MD sent at 10/16/2018 12:57 PM EDT ----- Regarding: RE: PET Denied  Jamariya Davidoff,  Can you tell the patient we are still working on this?  M ----- Message ----- From: Loa Socks Sent: 10/16/2018  12:06 PM EDT To: Lequita Asal, MD Subject: RE: PET Denied                                 Per [employer] reconsideration/P2P denied. FYI I will be requesting an appeal today. TY ----- Message ----- From: Lequita Asal, MD Sent: 10/15/2018  12:41 PM EDT To: Loa Socks Subject: RE: PET Denied                                  Sorry, I meant to send you a note on Friday.  The physician reviewer agreed with me (ie, PET).  He said that he couldn't make the final decision, but would submit his recommendations to whomever he does and that should help it get approved.  He could not give me a timeframe for the approval.  M ----- Message ----- From: Loa Socks Sent: 10/15/2018  10:47 AM EDT To: Lequita Asal, MD Subject: RE: PET Denied                                 Any update?  TY ----- Message ----- From: Lequita Asal, MD Sent: 10/11/2018   3:28 PM EDT To: Loa Socks Subject: RE: PET Denied                                  The call did not go to my regular cell.  It has been rescheduled to tomorrow at 11 AM.  I will let you know tomorrow.  M ----- Message ----- From: Loa Socks Sent: 10/11/2018   2:48 PM EDT To: Lequita Asal, MD, Secundino Ginger, # Subject: PET Denied                                     Dr. Loletha Grayer, were you able to complete the P2P on 7/31 for this PET?  I spoke with Khali Albanese last week and set up this P2P.  I know the patient has called several times and is very upset.  TY

## 2018-10-16 NOTE — Telephone Encounter (Signed)
Contacted patient with no success and unable to leave VM. Contacted husband (permission on file to release PHI) and informed him we are still in process with getting the PET approved through [facility] Husband reports he will update wife. Advised to have patient call back with any questions. Husband verbalizes understanding and denies any further questions or concerns.

## 2018-10-16 NOTE — Telephone Encounter (Signed)
Ayris called in inform Dr Mike Gip she is taking her care to [facility]. I informed Marita [place] to stop the authorization on the Pet Scan.

## 2019-05-10 ENCOUNTER — Ambulatory Visit: Payer: Medicare Other | Admitting: Urology

## 2019-10-17 ENCOUNTER — Other Ambulatory Visit: Payer: Self-pay | Admitting: Family Medicine

## 2019-10-17 DIAGNOSIS — Z1231 Encounter for screening mammogram for malignant neoplasm of breast: Secondary | ICD-10-CM

## 2019-10-31 ENCOUNTER — Ambulatory Visit: Payer: Medicare Other

## 2019-11-07 DIAGNOSIS — Z9484 Stem cells transplant status: Secondary | ICD-10-CM

## 2019-11-07 HISTORY — DX: Stem cells transplant status: Z94.84

## 2020-01-11 IMAGING — MG MM DIGITAL SCREENING BILAT W/ CAD
4 series · 4 of 4 positions shown · non-contrast
Comparison: Previous exam(s).

CLINICAL DATA: Screening.

EXAM:
DIGITAL SCREENING BILATERAL MAMMOGRAM WITH CAD

[L CC]
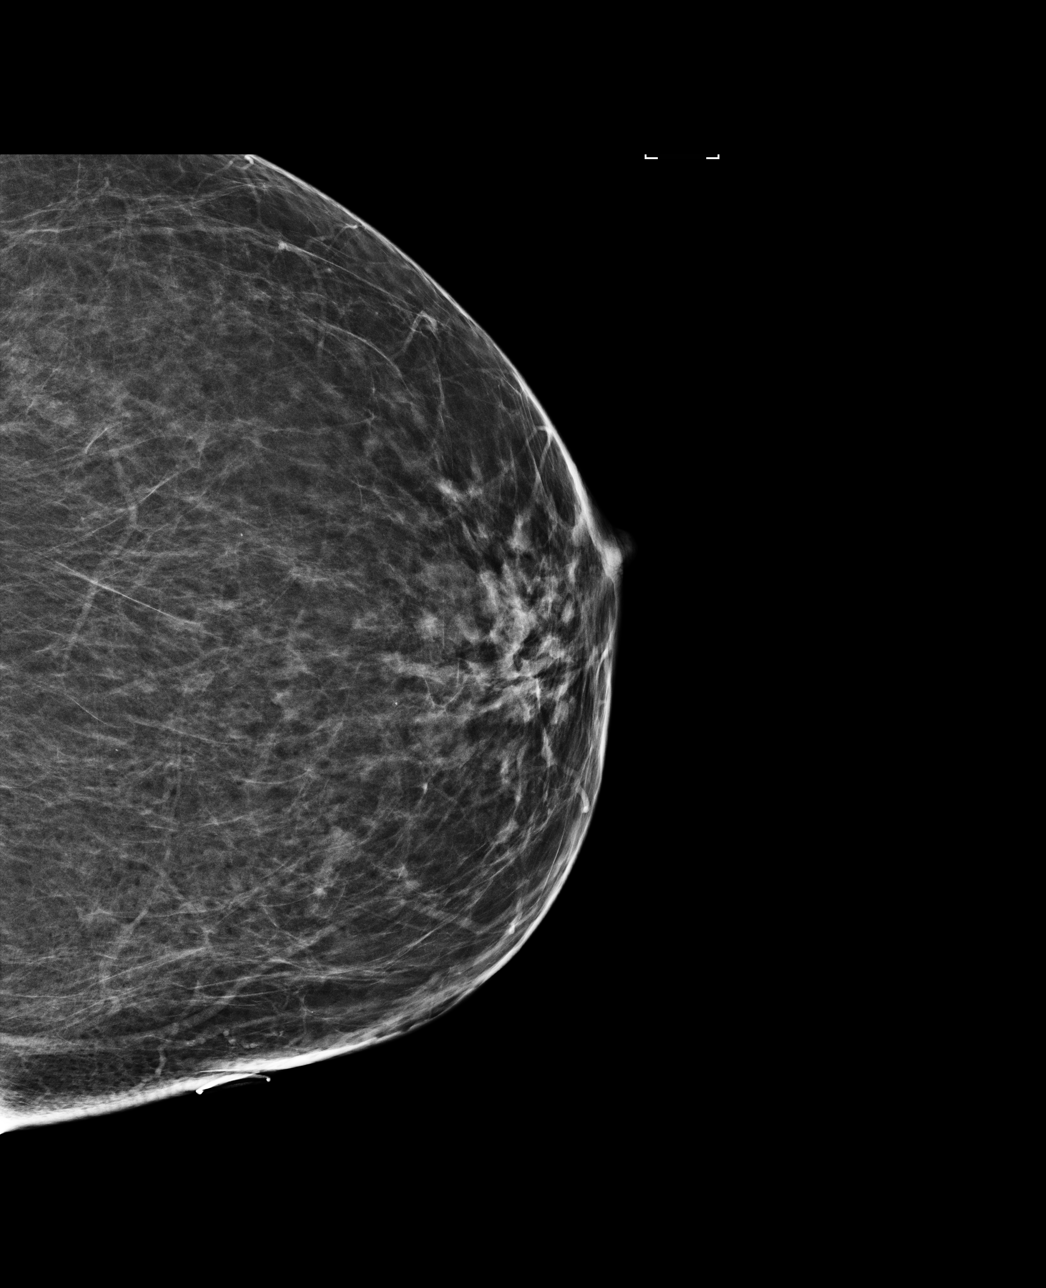

[R CC]
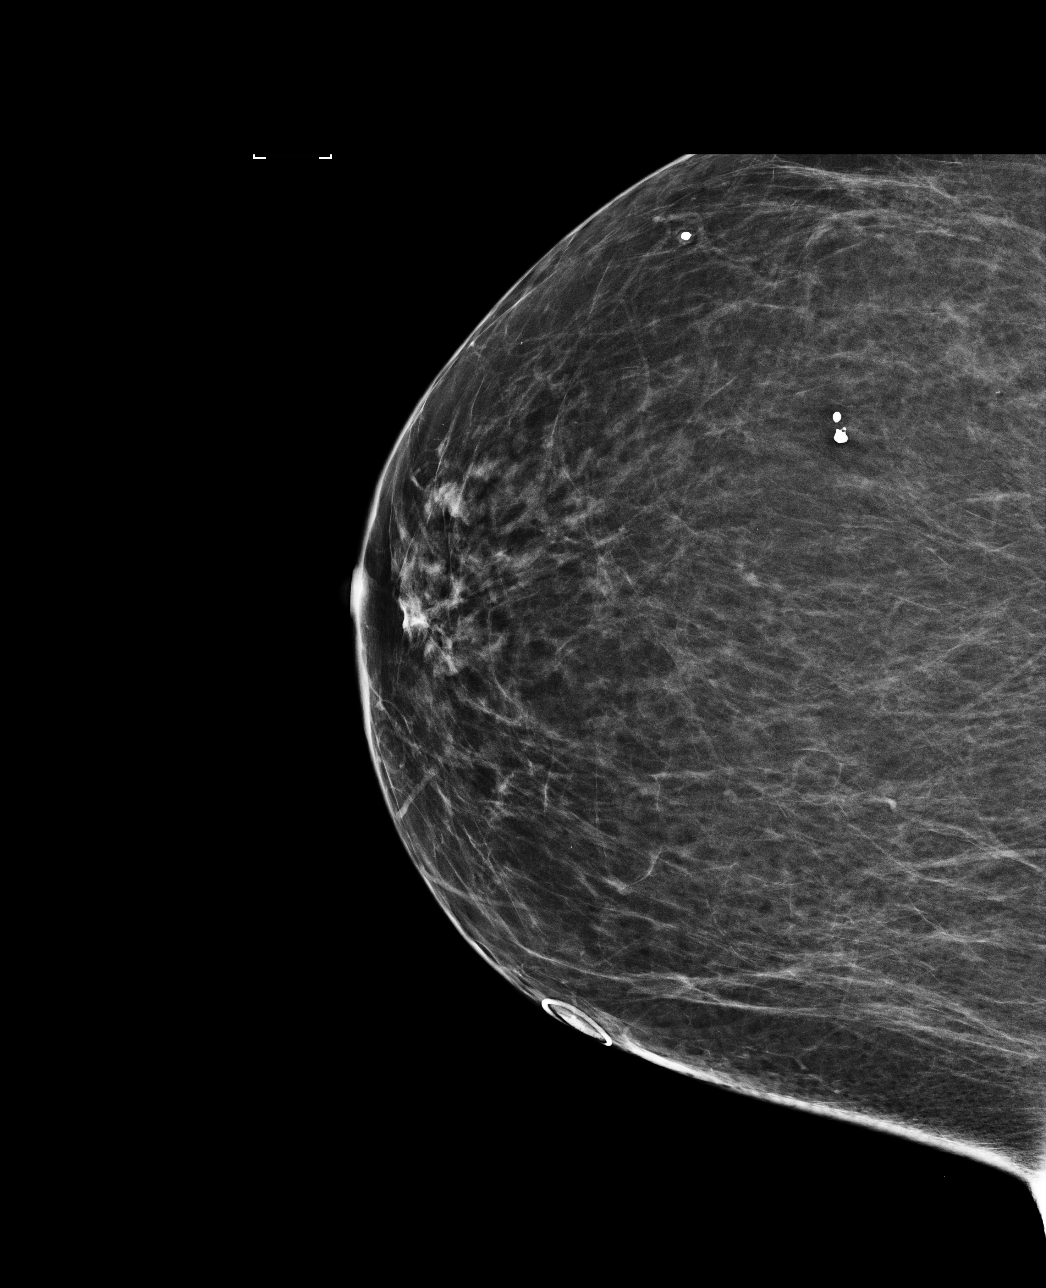

[L MLO]
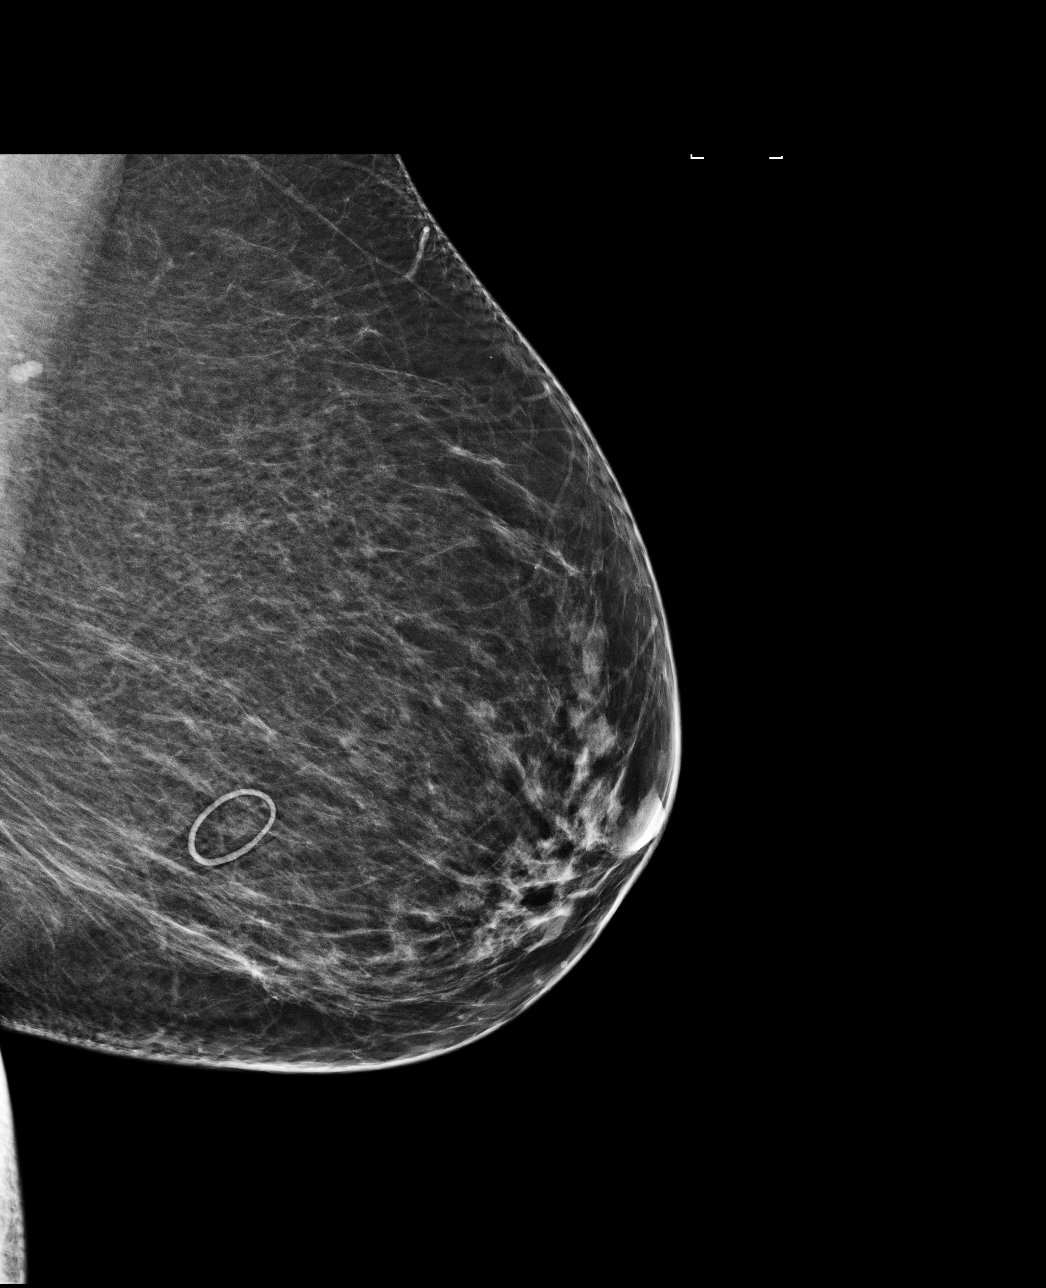

[R MLO]
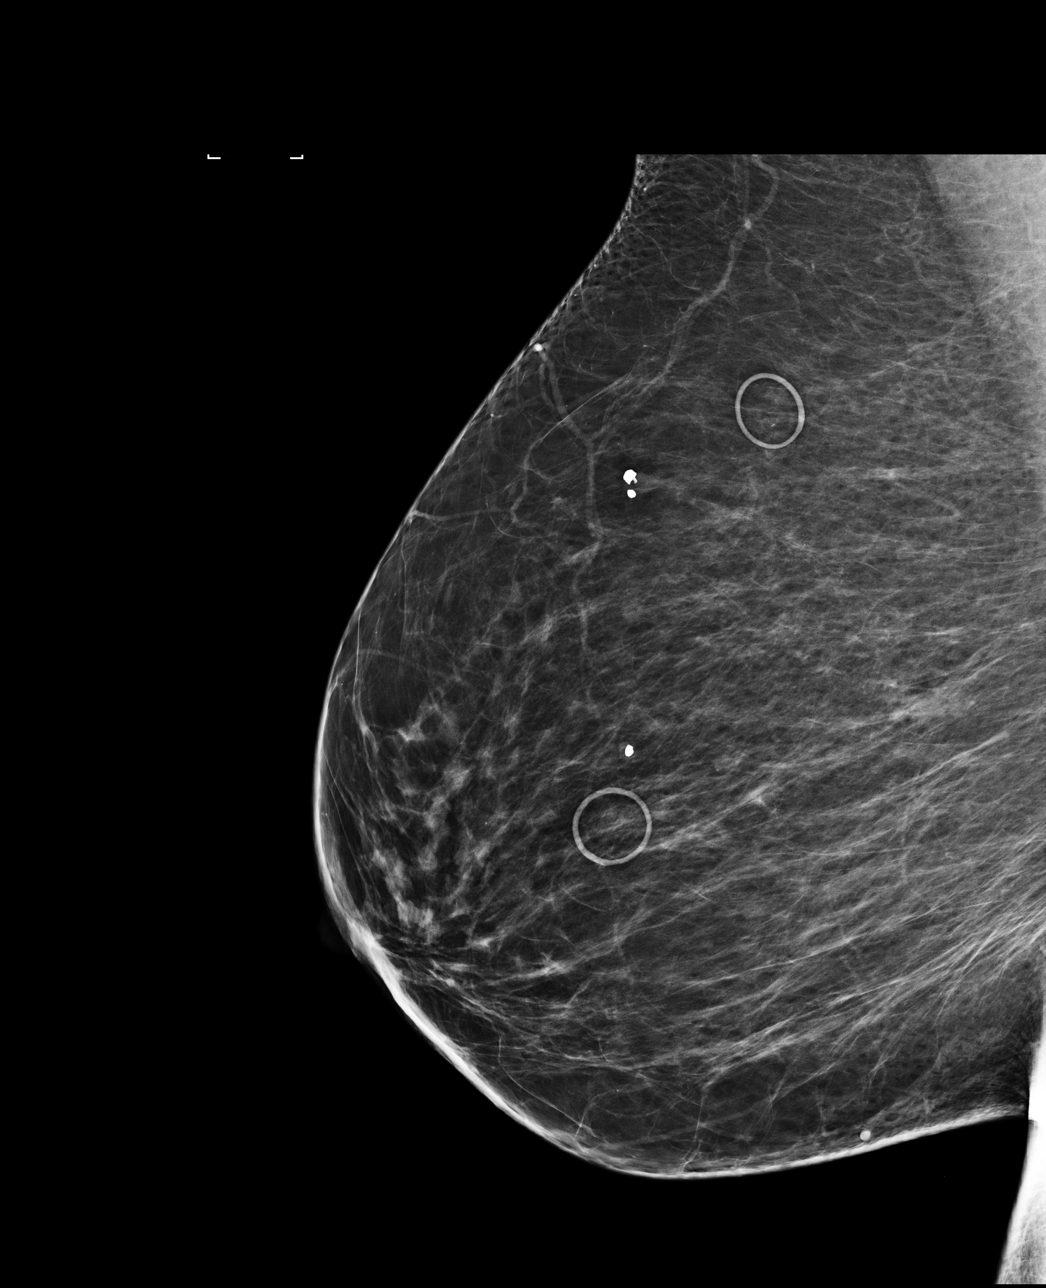

[4 of 4 positions shown; findings below may reference images not displayed]

ACR Breast Density Category b: There are scattered areas of
fibroglandular density.
FINDINGS: There are no findings suspicious for malignancy. Images were
processed with CAD.
IMPRESSION: No mammographic evidence of malignancy. A result letter of this
screening mammogram will be mailed directly to the patient.

RECOMMENDATION:
Screening mammogram in one year. (Code:AS-G-LCT)

BI-RADS CATEGORY  1: Negative.

## 2020-07-10 IMAGING — CR DG CHEST 2V
2 series · 2 of 2 positions shown · non-contrast
Comparison: No prior.

CLINICAL DATA: History of pleural effusion.

EXAM:
CHEST - 2 VIEW

[chest pa]
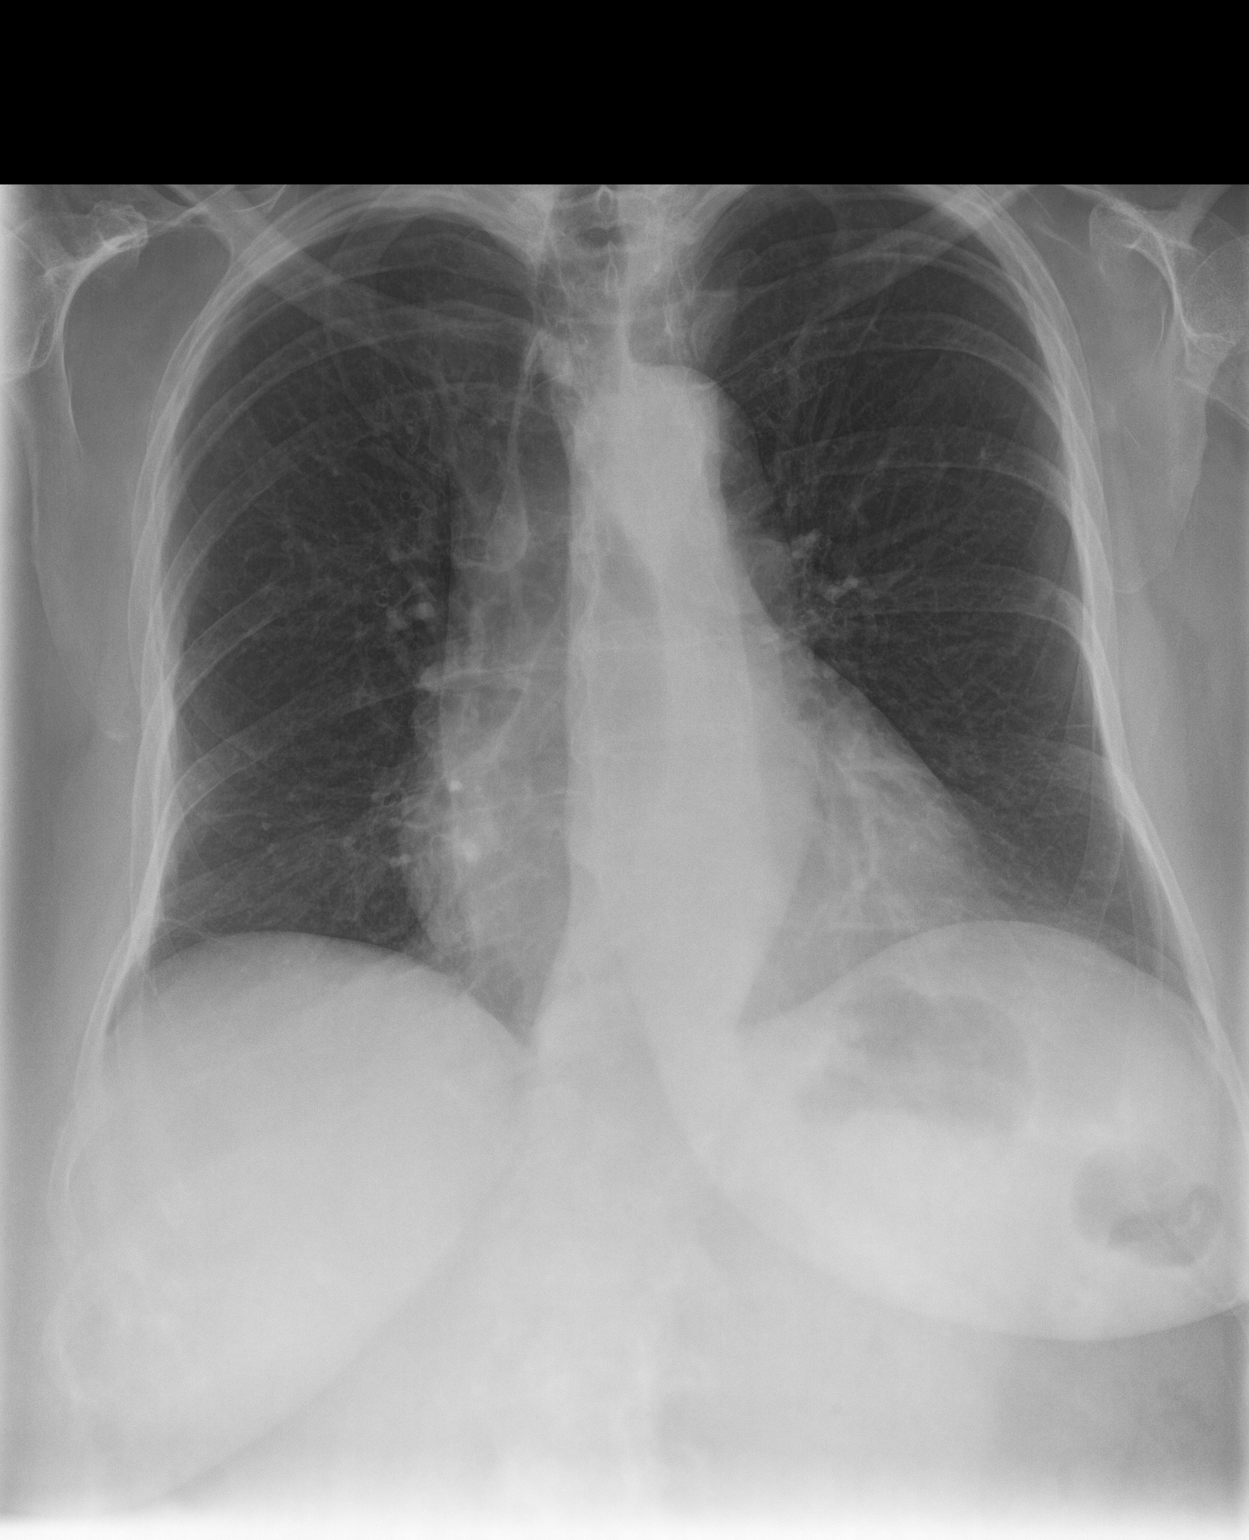

[chest lat]
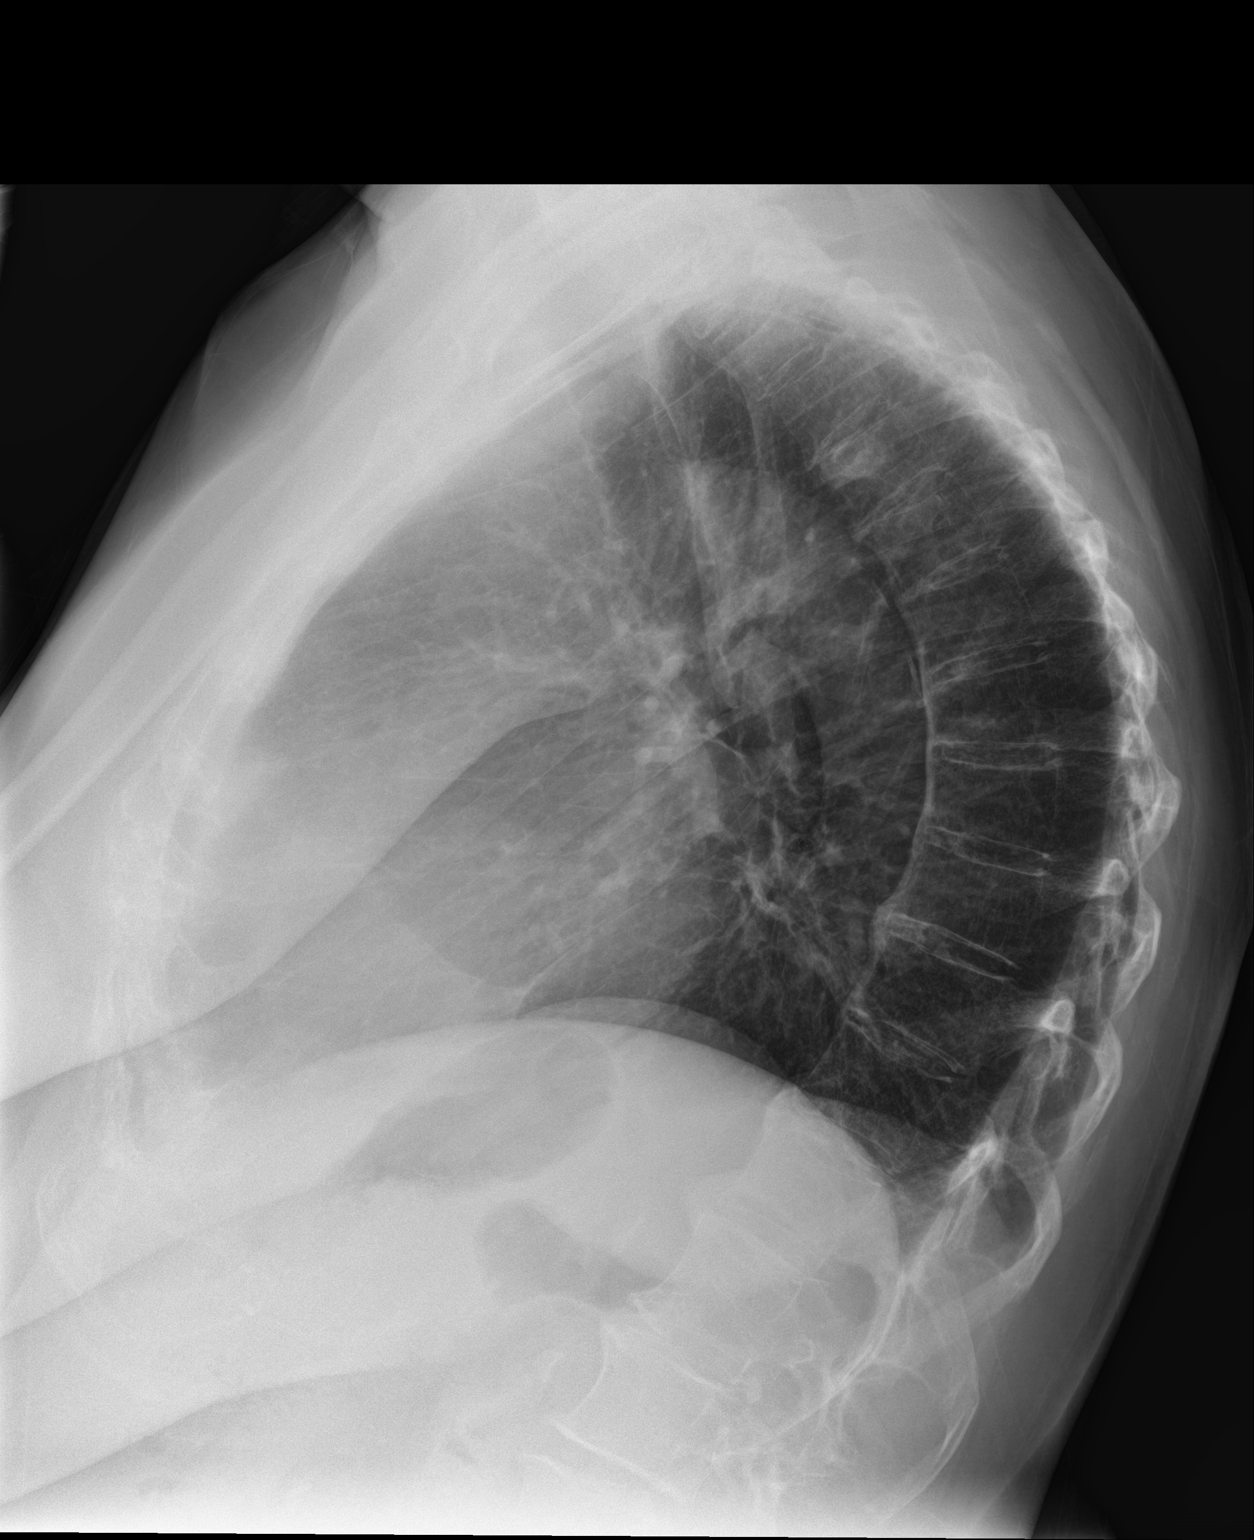

[2 of 2 positions shown; findings below may reference images not displayed]

FINDINGS: Mediastinum and hilar structures normal. Cardiomegaly. No pulmonary
venous congestion. Right middle lobe atelectatic changes and/or
infiltrate. Scarring could also present this fashion. Mild bibasilar
subsegmental atelectasis and or scarring. Cardiomegaly with normal
pulmonary vascularity. Degenerative changes scoliosis thoracic spine
IMPRESSION: 1. Right mid atelectatic changes and or infiltrate. Scarring could
also present this fashion. Mild bibasilar subsegmental atelectasis
and or scarring also noted. No pleural effusion identified.

2.  Cardiomegaly with normal pulmonary vascularity.

## 2022-09-06 ENCOUNTER — Encounter: Payer: Self-pay | Admitting: Ophthalmology

## 2022-09-07 NOTE — Anesthesia Preprocedure Evaluation (Addendum)
Anesthesia Evaluation  Patient identified by MRN, date of birth, ID band Patient awake    Reviewed: Allergy & Precautions, H&P , NPO status , Patient's Chart, lab work & pertinent test results  Airway Mallampati: III  TM Distance: >3 FB Neck ROM: Full    Dental no notable dental hx. (+) Edentulous Upper, Edentulous Lower Removed her dentures for procedure at her own desire:   Pulmonary neg pulmonary ROS, former smoker   Pulmonary exam normal breath sounds clear to auscultation       Cardiovascular hypertension, negative cardio ROS Normal cardiovascular exam Rhythm:Regular Rate:Normal     Neuro/Psych   Anxiety     negative neurological ROS  negative psych ROS   GI/Hepatic negative GI ROS, Neg liver ROS, PUD,GERD  ,,  Endo/Other  negative endocrine ROS    Renal/GU Renal diseasenegative Renal ROS  negative genitourinary   Musculoskeletal negative musculoskeletal ROS (+) Arthritis ,    Abdominal   Peds negative pediatric ROS (+)  Hematology negative hematology ROS (+) Blood dyscrasia, anemia   Anesthesia Other Findings Hypertension  High cholesterol History of kidney stones  GERD (gastroesophageal reflux disease) Anxiety  Arthritis Gout  Stomach ulcer Hyperlipidemia  Cancer (HCC) Bladder cancer (HCC)  Multiple myeloma in remission (HCC) H/O autologous stem cell transplant (HCC) Vertigo Wears dentures  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   Reproductive/Obstetrics negative OB ROS                             Anesthesia Physical Anesthesia Plan  ASA: 3  Anesthesia Plan: MAC   Post-op Pain Management:    Induction: Intravenous  PONV Risk Score and Plan:   Airway Management Planned: Natural Airway and Nasal Cannula  Additional Equipment:   Intra-op Plan:   Post-operative Plan:   Informed Consent: I have reviewed the patients History and Physical, chart, labs and discussed the  procedure including the risks, benefits and alternatives for the proposed anesthesia with the patient or authorized representative who has indicated his/her understanding and acceptance.     Dental Advisory Given  Plan Discussed with: Anesthesiologist, CRNA and Surgeon  Anesthesia Plan Comments: (Patient consented for risks of anesthesia including but not limited to:  - adverse reactions to medications - damage to eyes, teeth, lips or other oral mucosa - nerve damage due to positioning  - sore throat or hoarseness - Damage to heart, brain, nerves, lungs, other parts of body or loss of life  Patient voiced understanding.)       Anesthesia Quick Evaluation

## 2022-09-13 NOTE — Discharge Instructions (Signed)

## 2022-09-15 ENCOUNTER — Ambulatory Visit
Admission: RE | Admit: 2022-09-15 | Discharge: 2022-09-15 | Disposition: A | Discharge status: Home / Self Care | Payer: Medicare PPO | Attending: Ophthalmology | Admitting: Ophthalmology

## 2022-09-15 ENCOUNTER — Ambulatory Visit: Payer: Medicare PPO | Admitting: Anesthesiology

## 2022-09-15 ENCOUNTER — Encounter
Admission: RE | Disposition: A | Discharge status: Home / Self Care | Payer: Self-pay | Source: Home / Self Care | Attending: Ophthalmology

## 2022-09-15 ENCOUNTER — Encounter: Payer: Self-pay | Admitting: Ophthalmology

## 2022-09-15 ENCOUNTER — Other Ambulatory Visit: Payer: Self-pay

## 2022-09-15 DIAGNOSIS — F419 Anxiety disorder, unspecified: Secondary | ICD-10-CM | POA: Insufficient documentation

## 2022-09-15 DIAGNOSIS — K219 Gastro-esophageal reflux disease without esophagitis: Secondary | ICD-10-CM | POA: Insufficient documentation

## 2022-09-15 DIAGNOSIS — Z8711 Personal history of peptic ulcer disease: Secondary | ICD-10-CM | POA: Insufficient documentation

## 2022-09-15 DIAGNOSIS — I10 Essential (primary) hypertension: Secondary | ICD-10-CM | POA: Insufficient documentation

## 2022-09-15 DIAGNOSIS — Z87891 Personal history of nicotine dependence: Secondary | ICD-10-CM | POA: Diagnosis not present

## 2022-09-15 DIAGNOSIS — H2512 Age-related nuclear cataract, left eye: Secondary | ICD-10-CM | POA: Insufficient documentation

## 2022-09-15 HISTORY — DX: Multiple myeloma in remission: C90.01

## 2022-09-15 HISTORY — PX: CATARACT EXTRACTION W/PHACO: SHX586

## 2022-09-15 HISTORY — DX: Presence of dental prosthetic device (complete) (partial): Z97.2

## 2022-09-15 HISTORY — DX: Dizziness and giddiness: R42

## 2022-09-15 SURGERY — PHACOEMULSIFICATION, CATARACT, WITH IOL INSERTION
Anesthesia: Monitor Anesthesia Care | Site: Eye | Laterality: Left

## 2022-09-15 MED ORDER — TETRACAINE HCL 0.5 % OP SOLN
1.0000 [drp] | OPHTHALMIC | Status: DC | PRN
  Administered 2022-09-15 (×3): 1 [drp] via OPHTHALMIC

## 2022-09-15 MED ORDER — CYCLOPENTOLATE HCL 2 % OP SOLN
1.0000 [drp] | OPHTHALMIC | Status: DC | PRN
  Administered 2022-09-15 (×3): 1 [drp] via OPHTHALMIC

## 2022-09-15 MED ORDER — BRIMONIDINE TARTRATE-TIMOLOL 0.2-0.5 % OP SOLN
OPHTHALMIC | Status: DC | PRN
  Administered 2022-09-15: 1 [drp] via OPHTHALMIC

## 2022-09-15 MED ORDER — PHENYLEPHRINE HCL 10 % OP SOLN
1.0000 [drp] | OPHTHALMIC | Status: DC | PRN
  Administered 2022-09-15 (×3): 1 [drp] via OPHTHALMIC

## 2022-09-15 MED ORDER — FENTANYL CITRATE (PF) 100 MCG/2ML IJ SOLN
INTRAMUSCULAR | Status: DC | PRN
  Administered 2022-09-15: 50 ug via INTRAVENOUS

## 2022-09-15 MED ORDER — LIDOCAINE HCL (PF) 2 % IJ SOLN
INTRAOCULAR | Status: DC | PRN
  Administered 2022-09-15: 1 mL via INTRAOCULAR

## 2022-09-15 MED ORDER — SIGHTPATH DOSE#1 NA HYALUR & NA CHOND-NA HYALUR IO KIT
PACK | INTRAOCULAR | Status: DC | PRN
  Administered 2022-09-15: 1 via OPHTHALMIC

## 2022-09-15 MED ORDER — MOXIFLOXACIN HCL 0.5 % OP SOLN
OPHTHALMIC | Status: DC | PRN
  Administered 2022-09-15: .2 mL via OPHTHALMIC

## 2022-09-15 MED ORDER — MIDAZOLAM HCL 2 MG/2ML IJ SOLN
INTRAMUSCULAR | Status: DC | PRN
  Administered 2022-09-15: 1 mg via INTRAVENOUS

## 2022-09-15 MED ORDER — SIGHTPATH DOSE#1 BSS IO SOLN
INTRAOCULAR | Status: DC | PRN
  Administered 2022-09-15: 15 mL

## 2022-09-15 MED ORDER — PHENYLEPHRINE-KETOROLAC 1-0.3 % IO SOLN
INTRAOCULAR | Status: DC | PRN
  Administered 2022-09-15: 74 mL via OPHTHALMIC

## 2022-09-15 SURGICAL SUPPLY — 9 items
CATARACT SUITE SIGHTPATH (MISCELLANEOUS) ×1 IMPLANT
DISSECTOR HYDRO NUCLEUS 50X22 (MISCELLANEOUS) ×1 IMPLANT
DRSG TEGADERM 2-3/8X2-3/4 SM (GAUZE/BANDAGES/DRESSINGS) ×1 IMPLANT
FEE CATARACT SUITE SIGHTPATH (MISCELLANEOUS) ×1 IMPLANT
GLOVE SURG SYN 7.5 E (GLOVE) ×1 IMPLANT
GLOVE SURG SYN 7.5 PF PI (GLOVE) ×1 IMPLANT
GLOVE SURG SYN 8.5 E (GLOVE) ×1 IMPLANT
GLOVE SURG SYN 8.5 PF PI (GLOVE) ×1 IMPLANT
LENS IOL TECNIS EYHANCE 18.0 (Intraocular Lens) IMPLANT

## 2022-09-15 NOTE — Transfer of Care (Signed)
Immediate Anesthesia Transfer of Care Note  Patient: Samantha Blankenship  Procedure(s) Performed: CATARACT EXTRACTION PHACO AND INTRAOCULAR LENS PLACEMENT (IOC) LEFT omidria  6.80  00:51.3 (Left: Eye)  Patient Location: PACU  Anesthesia Type: MAC  Level of Consciousness: awake, alert  and patient cooperative  Airway and Oxygen Therapy: Patient Spontanous Breathing and Patient connected to supplemental oxygen  Post-op Assessment: Post-op Vital signs reviewed, Patient's Cardiovascular Status Stable, Respiratory Function Stable, Patent Airway and No signs of Nausea or vomiting  Post-op Vital Signs: Reviewed and stable  Complications: No notable events documented.

## 2022-09-15 NOTE — H&P (Signed)
[facility]   Primary Care Physician:  Lazarus Gowda, MD Ophthalmologist: Dr. Deberah Pelton  Pre-Procedure History & Physical: HPI:  Samantha Blankenship is a 72 y.o. female here for cataract surgery.   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   High cholesterol    History of kidney stones    Hyperlipidemia    Hypertension    Multiple myeloma in remission (HCC)    Stomach ulcer    Vertigo    distant past   Wears dentures    full upper and lower    Past Surgical History:  Procedure Laterality Date   ABDOMINAL HYSTERECTOMY     BLADDER ASPIRATION     BLADDER SURGERY  1999   EYE SURGERY     GALLBLADDER SURGERY     HEMORROIDECTOMY     HERNIA REPAIR     KIDNEY STONE SURGERY     TUMOR REMOVAL      Prior to Admission medications   Medication Sig Start Date End Date Taking? Authorizing Provider  acyclovir (ZOVIRAX) 200 MG capsule Take 200 mg by mouth 2 (two) times daily.   Yes [provider]  albuterol (VENTOLIN HFA) 108 (90 Base) MCG/ACT inhaler Inhale 1-2 puffs into the lungs every 4 (four) hours as needed.  05/08/18 09/06/22 Yes [provider]  allopurinol (ZYLOPRIM) 100 MG tablet Take 100 mg by mouth daily.   Yes [provider]  apixaban (ELIQUIS) 2.5 MG TABS tablet Take 2.5 mg by mouth 2 (two) times daily.   Yes [provider]  B Complex Vitamins (VITAMIN B-COMPLEX) TABS Take 1 tablet by mouth daily.    Yes [provider]  Cholecalciferol (VITAMIN D3) 250 MCG (10000 UT) TABS Take 1 tablet by mouth 2 (two) times a day.   Yes [provider]  DULoxetine (CYMBALTA) 30 MG capsule Take 30 mg by mouth daily.   Yes [provider]  fenofibrate micronized (LOFIBRA) 134 MG capsule Take 134 mg by mouth daily before breakfast.    Yes [provider]  gabapentin (NEURONTIN) 300 MG capsule Take 600 mg by mouth 3 (three) times daily.   Yes [provider]  lenalidomide (REVLIMID) 10 MG capsule Take 10 mg by mouth daily with supper. [employer] Auth #     Date Obtained   Yes [provider]  metoprolol tartrate (LOPRESSOR) 25 MG tablet Take 12.5 mg by mouth 2 (two) times daily.   Yes [provider]  pantoprazole (PROTONIX) 40 MG tablet Take 40 mg by mouth 2 (two) times daily.   Yes [provider]  prenatal vitamin w/FE, FA (NATACHEW) 29-1 MG CHEW chewable tablet Chew 1 tablet by mouth daily at 12 noon.   Yes [provider]  simvastatin (ZOCOR) 80 MG tablet Take 80 mg by mouth daily.   Yes [provider]  tamsulosin (FLOMAX) 0.4 MG CAPS capsule Take 0.4 mg by mouth daily.   Yes [provider]    Allergies as of 08/09/2022 - Review Complete 09/17/2018  Allergen Reaction Noted   Aspirin Shortness Of Breath and Swelling 03/20/2012   Carbinoxamine Shortness Of Breath and Swelling 03/20/2012   Cetirizine Shortness Of Breath and Swelling 03/20/2012   Coffea arabica Shortness Of Breath 03/20/2012  Other Shortness Of Breath, Swelling, and Other (See Comments) 03/20/2012   Shellfish allergy Shortness Of Breath and Swelling 03/20/2012   Sulfasalazine Palpitations, Rash, and Swelling 12/06/2012   Grapefruit extract Swelling 03/20/2012   Okra Swelling 03/20/2012   Penicillins  06/13/2017   Sulfa antibiotics  06/13/2017   Pseudoephedrine Palpitations 06/16/2011    Family History  Problem Relation Age of Onset   Hypertension Mother    Heart failure Father    Prostate cancer Maternal Grandfather    Breast cancer Neg Hx     Social History   Socioeconomic History   Marital status: Married    Spouse name: Not on file   Number of children: Not on file   Years of education: Not on file   Highest education level: Not on file  Occupational History   Not  on file  Tobacco Use   Smoking status: Former   Smokeless tobacco: Never   Tobacco comments:    social smoker 25 years ago  Vaping Use   Vaping status: Never Used  Substance and Sexual Activity   Alcohol use: Never   Drug use: Never   Sexual activity: Yes  Other Topics Concern   Not on file  Social History Narrative   Not on file   Social Determinants of Health   Financial Resource Strain: High Risk (05/24/2022)   Received from [facility], [facility]   Overall Financial Resource Strain (CARDIA)    Difficulty of Paying Living Expenses: Hard  Food Insecurity: Food Insecurity Present (05/24/2022)   Received from [facility], [facility]   Hunger Vital Sign    Worried About Running Out of Food in the Last Year: Often true    Ran Out of Food in the Last Year: Sometimes true  Transportation Needs: Unmet Transportation Needs (05/24/2022)   Received from [facility], [facility]   PRAPARE - Transportation    In the past 12 months, has lack of transportation kept you from medical appointments or from getting medications?: Yes    Lack of Transportation (Non-Medical): Yes  Physical Activity: Sufficiently Active (05/24/2022)   Received from [facility], [facility]   Exercise Vital Sign    Days of Exercise per Week: 7 days    Minutes of Exercise per Session: 90 min  Stress: No Stress Concern Present (05/24/2022)   Received from [facility], [facility]   [employer] of Occupational Health - Occupational Stress Questionnaire    Feeling of Stress : Only a little  Social Connections: Moderately Integrated (05/24/2022)   Received from [facility], [facility]   Social Connection and Isolation Panel [NHANES]    Frequency of Communication with Friends and Family: More than  three times a week    Frequency of Social Gatherings with Friends and Family: Three times a week    Attends Religious Services: More than 4 times per year    Active Member of Clubs or Organizations: Yes    Attends Banker Meetings: More than 4 times per year    Marital Status: Widowed  Intimate Partner Violence: Unknown (07/07/2021)   Received from [facility], [facility]   HITS    Physically Hurt: Not on file    Insult or Talk Down To: Not on file    Threaten Physical Harm: Not on file    Scream or Curse:  Not on file    Review of Systems: See HPI, otherwise negative ROS  Physical Exam: Ht 5\' 8"  (1.727 m)   Wt 85.3 kg   BMI 28.59 kg/m  General:   Alert, cooperative in NAD Head:  Normocephalic and atraumatic. Respiratory:  Normal work of breathing. Cardiovascular:  RRR  Impression/Plan: Samantha Blankenship is here for cataract surgery.  Risks, benefits, limitations, and alternatives regarding cataract surgery have been reviewed with the patient.  Questions have been answered.  All parties agreeable.   Estanislado Pandy, MD  09/15/2022, 7:05 AM

## 2022-09-15 NOTE — Op Note (Signed)
OPERATIVE NOTE  Samantha Blankenship [id number] 09/15/2022   PREOPERATIVE DIAGNOSIS: Nuclear sclerotic cataract left eye. H25.12   POSTOPERATIVE DIAGNOSIS: Nuclear sclerotic cataract left eye. H25.12   PROCEDURE:  Phacoemusification with posterior chamber intraocular lens placement of the left eye  Ultrasound time: Procedure(s): CATARACT EXTRACTION PHACO AND INTRAOCULAR LENS PLACEMENT (IOC) LEFT omidria  6.80  00:51.3 (Left)  LENS:   Implant Name Type Inv. Item Serial No. Manufacturer Lot No. LRB No. Used Action  LENS IOL TECNIS EYHANCE 18.0 - [id number] Intraocular Lens LENS IOL TECNIS EYHANCE 18.0 [id number] [employer]  Left 1 Implanted      SURGEON:  Julious Payer. Rolley Sims, MD   ANESTHESIA:  Topical with tetracaine drops, augmented with 1% preservative-free intracameral lidocaine.   COMPLICATIONS:  None.   DESCRIPTION OF PROCEDURE:  The patient was identified in the holding room and transported to the operating room and placed in the supine position under the operating microscope.  The left eye was identified as the operative eye, which was prepped and draped in the usual sterile ophthalmic fashion.   A 1 millimeter clear-corneal paracentesis was made inferotemporally. Preservative-free 1% lidocaine mixed with 1:1,000 bisulfite-free aqueous solution of epinephrine was injected into the anterior chamber. The anterior chamber was then filled with Viscoat viscoelastic. A 2.4 millimeter keratome was used to make a clear-corneal incision superotemporally. A curvilinear capsulorrhexis was made with a cystotome and capsulorrhexis forceps. Balanced salt solution was used to hydrodissect and hydrodelineate the nucleus. Phacoemulsification was then used to remove the lens nucleus and epinucleus. The remaining cortex was then removed using the irrigation and aspiration handpiece. Provisc was then placed into the capsular bag to distend it for lens placement. A +18.00 D DIB00 intraocular lens was then injected  into the capsular bag. The remaining viscoelastic was aspirated.   Wounds were hydrated with balanced salt solution.  The anterior chamber was inflated to a physiologic pressure with balanced salt solution.  No wound leaks were noted. Vigamox was injected intracamerally.  Timolol and Brimonidine drops were applied to the eye.  The patient was taken to the recovery room in stable condition without complications of anesthesia or surgery.  [name] 09/15/2022, 9:02 AM

## 2022-09-15 NOTE — Anesthesia Postprocedure Evaluation (Signed)
Anesthesia Post Note  Patient: [name]  Procedure(s) Performed: CATARACT EXTRACTION PHACO AND INTRAOCULAR LENS PLACEMENT (IOC) LEFT omidria  6.80  00:51.3 (Left: Eye)  Patient location during evaluation: PACU Anesthesia Type: MAC Level of consciousness: awake and alert Pain management: pain level controlled Vital Signs Assessment: post-procedure vital signs reviewed and stable Respiratory status: spontaneous breathing, nonlabored ventilation, respiratory function stable and patient connected to nasal cannula oxygen Cardiovascular status: stable and blood pressure returned to baseline Postop Assessment: no apparent nausea or vomiting Anesthetic complications: no   No notable events documented.   Last Vitals:  Vitals:   09/15/22 0900 09/15/22 0909  BP: 111/89 114/66  Resp: 15 12  Temp: (!) 36.4 C (!) 36.4 C  SpO2: 95% 96%    Last Pain:  Vitals:   09/15/22 0909  TempSrc:   PainSc: 0-No pain                 Marisue Humble

## 2023-01-24 NOTE — Discharge Instructions (Signed)

## 2023-01-25 ENCOUNTER — Encounter: Payer: Self-pay | Admitting: Ophthalmology

## 2023-01-25 NOTE — Anesthesia Preprocedure Evaluation (Addendum)
Anesthesia Evaluation  Patient identified by MRN, date of birth, ID band Patient awake    Reviewed: Allergy & Precautions, H&P , NPO status , Patient's Chart, lab work & pertinent test results  Airway Mallampati: III  TM Distance: >3 FB     Dental  (+) Edentulous Upper, Edentulous Lower  Edentulous Upper, Edentulous Lower Removed her dentures for procedure at her own desire:  :   Pulmonary neg pulmonary ROS, former smoker          Cardiovascular hypertension, Pt. on home beta blockers and Pt. on medications negative cardio ROS   12-30-21 INTERPRETATION ---------------------------------------------------------------    NORMAL LEFT VENTRICULAR SYSTOLIC FUNCTION WITH MILD LVH    NORMAL RIGHT VENTRICULAR SYSTOLIC FUNCTION    VALVULAR REGURGITATION: TRIVIAL MR, TRIVIAL PR, TRIVIAL TR    NO VALVULAR STENOSIS    TRIVIAL PERICARDIAL EFFUSION     Compared with prior Echo study on 10/11/2019: NO SIGNIFICANT CHANGES      Neuro/Psych   Anxiety     negative neurological ROS  negative psych ROS   GI/Hepatic negative GI ROS, Neg liver ROS, PUD,GERD  ,,  Endo/Other  negative endocrine ROS    Renal/GU Renal diseasenegative Renal ROS  negative genitourinary   Musculoskeletal negative musculoskeletal ROS (+) Arthritis ,    Abdominal   Peds negative pediatric ROS (+)  Hematology negative hematology ROS (+) Blood dyscrasia, anemia   Anesthesia Other Findings Medical History Hypertension  High cholesterol History of kidney stones  GERD (gastroesophageal reflux disease) Anxiety  Arthritis Gout  Stomach ulcer Hyperlipidemia  Cancer (HCC) Bladder cancer (HCC)  Hx paroxysmal atrial fibrillation Multiple myeloma in remission (HCC) H/O autologous stem cell transplant (HCC) Vertigo Wears dentures  Previous cataract surgery 09-15-22 with Dr. Juel Burrow anesthesiologist   Reproductive/Obstetrics negative OB ROS                              Anesthesia Physical Anesthesia Plan  ASA: 3  Anesthesia Plan: MAC   Post-op Pain Management:    Induction: Intravenous  PONV Risk Score and Plan:   Airway Management Planned: Natural Airway and Nasal Cannula  Additional Equipment:   Intra-op Plan:   Post-operative Plan:   Informed Consent: I have reviewed the patients History and Physical, chart, labs and discussed the procedure including the risks, benefits and alternatives for the proposed anesthesia with the patient or authorized representative who has indicated his/her understanding and acceptance.     Dental Advisory Given  Plan Discussed with: Anesthesiologist, CRNA and Surgeon  Anesthesia Plan Comments: (Patient consented for risks of anesthesia including but not limited to:  - adverse reactions to medications - damage to eyes, teeth, lips or other oral mucosa - nerve damage due to positioning  - sore throat or hoarseness - Damage to heart, brain, nerves, lungs, other parts of body or loss of life  Patient voiced understanding and assent.)        Anesthesia Quick Evaluation

## 2023-01-26 ENCOUNTER — Encounter: Payer: Self-pay | Admitting: Ophthalmology

## 2023-01-26 ENCOUNTER — Encounter
Admission: RE | Disposition: A | Discharge status: Home / Self Care | Payer: Self-pay | Source: Home / Self Care | Attending: Ophthalmology

## 2023-01-26 ENCOUNTER — Other Ambulatory Visit: Payer: Self-pay

## 2023-01-26 ENCOUNTER — Ambulatory Visit: Payer: Medicare PPO | Admitting: Anesthesiology

## 2023-01-26 ENCOUNTER — Ambulatory Visit
Admission: RE | Admit: 2023-01-26 | Discharge: 2023-01-26 | Disposition: A | Discharge status: Home / Self Care | Payer: Medicare PPO | Attending: Ophthalmology | Admitting: Ophthalmology

## 2023-01-26 DIAGNOSIS — F419 Anxiety disorder, unspecified: Secondary | ICD-10-CM | POA: Diagnosis not present

## 2023-01-26 DIAGNOSIS — N289 Disorder of kidney and ureter, unspecified: Secondary | ICD-10-CM | POA: Insufficient documentation

## 2023-01-26 DIAGNOSIS — Z5986 Financial insecurity: Secondary | ICD-10-CM | POA: Diagnosis not present

## 2023-01-26 DIAGNOSIS — Z87891 Personal history of nicotine dependence: Secondary | ICD-10-CM | POA: Insufficient documentation

## 2023-01-26 DIAGNOSIS — M1990 Unspecified osteoarthritis, unspecified site: Secondary | ICD-10-CM | POA: Insufficient documentation

## 2023-01-26 DIAGNOSIS — I10 Essential (primary) hypertension: Secondary | ICD-10-CM | POA: Diagnosis not present

## 2023-01-26 DIAGNOSIS — Z8711 Personal history of peptic ulcer disease: Secondary | ICD-10-CM | POA: Diagnosis not present

## 2023-01-26 DIAGNOSIS — D649 Anemia, unspecified: Secondary | ICD-10-CM | POA: Diagnosis not present

## 2023-01-26 DIAGNOSIS — H2511 Age-related nuclear cataract, right eye: Secondary | ICD-10-CM | POA: Insufficient documentation

## 2023-01-26 DIAGNOSIS — Z79899 Other long term (current) drug therapy: Secondary | ICD-10-CM | POA: Diagnosis not present

## 2023-01-26 DIAGNOSIS — K219 Gastro-esophageal reflux disease without esophagitis: Secondary | ICD-10-CM | POA: Insufficient documentation

## 2023-01-26 DIAGNOSIS — Z8249 Family history of ischemic heart disease and other diseases of the circulatory system: Secondary | ICD-10-CM | POA: Insufficient documentation

## 2023-01-26 HISTORY — PX: CATARACT EXTRACTION W/PHACO: SHX586

## 2023-01-26 HISTORY — DX: Drug-induced polyneuropathy: T45.1X5A

## 2023-01-26 HISTORY — DX: Paroxysmal atrial fibrillation: I48.0

## 2023-01-26 HISTORY — DX: Other seasonal allergic rhinitis: J30.2

## 2023-01-26 HISTORY — DX: Drug-induced polyneuropathy: G62.0

## 2023-01-26 SURGERY — PHACOEMULSIFICATION, CATARACT, WITH IOL INSERTION
Anesthesia: Monitor Anesthesia Care | Site: Eye | Laterality: Right

## 2023-01-26 MED ORDER — LIDOCAINE HCL (PF) 2 % IJ SOLN
INTRAOCULAR | Status: DC | PRN
  Administered 2023-01-26: 4 mL via INTRAOCULAR

## 2023-01-26 MED ORDER — SIGHTPATH DOSE#1 NA HYALUR & NA CHOND-NA HYALUR IO KIT
PACK | INTRAOCULAR | Status: DC | PRN
  Administered 2023-01-26: 1 via OPHTHALMIC

## 2023-01-26 MED ORDER — FENTANYL CITRATE (PF) 100 MCG/2ML IJ SOLN
INTRAMUSCULAR | Status: AC
  Filled 2023-01-26: qty 2

## 2023-01-26 MED ORDER — CYCLOPENTOLATE HCL 2 % OP SOLN
1.0000 [drp] | OPHTHALMIC | Status: DC | PRN
  Administered 2023-01-26 (×3): 1 [drp] via OPHTHALMIC

## 2023-01-26 MED ORDER — FENTANYL CITRATE (PF) 100 MCG/2ML IJ SOLN
INTRAMUSCULAR | Status: DC | PRN
  Administered 2023-01-26: 50 ug via INTRAVENOUS

## 2023-01-26 MED ORDER — MIDAZOLAM HCL 2 MG/2ML IJ SOLN
INTRAMUSCULAR | Status: DC | PRN
  Administered 2023-01-26: 1 mg via INTRAVENOUS

## 2023-01-26 MED ORDER — MOXIFLOXACIN HCL 0.5 % OP SOLN
OPHTHALMIC | Status: DC | PRN
  Administered 2023-01-26: .2 mL via OPHTHALMIC

## 2023-01-26 MED ORDER — PHENYLEPHRINE HCL 10 % OP SOLN
OPHTHALMIC | Status: AC
  Filled 2023-01-26: qty 5

## 2023-01-26 MED ORDER — MIDAZOLAM HCL 2 MG/2ML IJ SOLN
INTRAMUSCULAR | Status: AC
  Filled 2023-01-26: qty 2

## 2023-01-26 MED ORDER — TETRACAINE HCL 0.5 % OP SOLN
OPHTHALMIC | Status: AC
  Filled 2023-01-26: qty 4

## 2023-01-26 MED ORDER — PHENYLEPHRINE HCL 10 % OP SOLN
1.0000 [drp] | OPHTHALMIC | Status: AC
  Administered 2023-01-26 (×3): 1 [drp] via OPHTHALMIC

## 2023-01-26 MED ORDER — SIGHTPATH DOSE#1 BSS IO SOLN
INTRAOCULAR | Status: DC | PRN
  Administered 2023-01-26: 73 mL via OPHTHALMIC

## 2023-01-26 MED ORDER — ARMC OPHTHALMIC DILATING DROPS
OPHTHALMIC | Status: AC
  Filled 2023-01-26: qty 0.5

## 2023-01-26 MED ORDER — BRIMONIDINE TARTRATE-TIMOLOL 0.2-0.5 % OP SOLN
OPHTHALMIC | Status: DC | PRN
  Administered 2023-01-26: 1 [drp] via OPHTHALMIC

## 2023-01-26 MED ORDER — TETRACAINE HCL 0.5 % OP SOLN
1.0000 [drp] | OPHTHALMIC | Status: DC | PRN
  Administered 2023-01-26 (×3): 1 [drp] via OPHTHALMIC

## 2023-01-26 MED ORDER — SIGHTPATH DOSE#1 BSS IO SOLN
INTRAOCULAR | Status: DC | PRN
  Administered 2023-01-26: 15 mL via INTRAOCULAR

## 2023-01-26 MED ORDER — CYCLOPENTOLATE HCL 2 % OP SOLN
OPHTHALMIC | Status: AC
  Filled 2023-01-26: qty 2

## 2023-01-26 SURGICAL SUPPLY — 20 items
BNDG EYE OVAL 2 1/8 X 2 5/8 (GAUZE/BANDAGES/DRESSINGS) IMPLANT
CANNULA ANT/CHMB 27G (MISCELLANEOUS) IMPLANT
CANNULA ANT/CHMB 27GA (MISCELLANEOUS)
CATARACT SUITE SIGHTPATH (MISCELLANEOUS) ×1
DISSECTOR HYDRO NUCLEUS 50X22 (MISCELLANEOUS) ×1 IMPLANT
DRSG TEGADERM 2-3/8X2-3/4 SM (GAUZE/BANDAGES/DRESSINGS) ×1 IMPLANT
FEE CATARACT SUITE SIGHTPATH (MISCELLANEOUS) ×1 IMPLANT
GLOVE SURG SYN 7.5 E (GLOVE) ×1
GLOVE SURG SYN 7.5 PF PI (GLOVE) ×1 IMPLANT
GLOVE SURG SYN 8.5 E (GLOVE) ×1
GLOVE SURG SYN 8.5 PF PI (GLOVE) ×1 IMPLANT
LENS IOL TECNIS EYHANCE 18.0 (Intraocular Lens) IMPLANT
NDL FILTER BLUNT 18X1 1/2 (NEEDLE) IMPLANT
NEEDLE FILTER BLUNT 18X1 1/2 (NEEDLE)
PACK VIT ANT 23G (MISCELLANEOUS) IMPLANT
RING MALYGIN 7.0 (MISCELLANEOUS) IMPLANT
SUT ETHILON 10-0 CS-B-6CS-B-6 (SUTURE)
SUTURE EHLN 10-0 CS-B-6CS-B-6 (SUTURE) IMPLANT
SYR 3ML LL SCALE MARK (SYRINGE) IMPLANT
SYR 5ML LL (SYRINGE) IMPLANT

## 2023-01-26 NOTE — Op Note (Signed)
OPERATIVE NOTE  Samantha Blankenship [id number] 01/26/2023   PREOPERATIVE DIAGNOSIS: Nuclear sclerotic cataract right eye. H25.11   POSTOPERATIVE DIAGNOSIS: Nuclear sclerotic cataract right eye. H25.11   PROCEDURE:  Phacoemusification with posterior chamber intraocular lens placement of the right eye  Ultrasound time: Procedure(s): CATARACT EXTRACTION PHACO AND INTRAOCULAR LENS PLACEMENT (IOC) RIGHT 8.49 00:50.1 (Right)  LENS:   Implant Name Type Inv. Item Serial No. Manufacturer Lot No. LRB No. Used Action  LENS IOL TECNIS EYHANCE 18.0 - [id number] Intraocular Lens LENS IOL TECNIS EYHANCE 18.0 [id number] [employer]  Right 1 Implanted      SURGEON:  Julious Payer. Rolley Sims, MD   ANESTHESIA:  Topical with tetracaine drops, augmented with 1% preservative-free intracameral lidocaine.   COMPLICATIONS:  None.   DESCRIPTION OF PROCEDURE:  The patient was identified in the holding room and transported to the operating room and placed in the supine position under the operating microscope.  The right eye was identified as the operative eye, which was prepped and draped in the usual sterile ophthalmic fashion.   A 1 millimeter clear-corneal paracentesis was made superotemporally. Preservative-free 1% lidocaine mixed with 1:1,000 bisulfite-free aqueous solution of epinephrine was injected into the anterior chamber. The anterior chamber was then filled with Viscoat viscoelastic. A 2.4 millimeter keratome was used to make a clear-corneal incision inferotemporally. A curvilinear capsulorrhexis was made with a cystotome and capsulorrhexis forceps. Balanced salt solution was used to hydrodissect and hydrodelineate the nucleus. Phacoemulsification was then used to remove the lens nucleus and epinucleus. The remaining cortex was then removed using the irrigation and aspiration handpiece. Provisc was then placed into the capsular bag to distend it for lens placement. A +18.00 D DIB00 intraocular lens was then injected  into the capsular bag. The remaining viscoelastic was aspirated.   Wounds were hydrated with balanced salt solution.  The anterior chamber was inflated to a physiologic pressure with balanced salt solution.  No wound leaks were noted. Vigamox was injected intracamerally.  Timolol and Brimonidine drops were applied to the eye.  The patient was taken to the recovery room in stable condition without complications of anesthesia or surgery.  [employer] 01/26/2023, 10:30 AM

## 2023-01-26 NOTE — Anesthesia Postprocedure Evaluation (Signed)
Anesthesia Post Note  Patient: Arrielle Dofflemyer  Procedure(s) Performed: CATARACT EXTRACTION PHACO AND INTRAOCULAR LENS PLACEMENT (IOC) RIGHT 8.49 00:50.1 (Right: Eye)  Patient location during evaluation: PACU Anesthesia Type: MAC Level of consciousness: awake and alert Pain management: pain level controlled Vital Signs Assessment: post-procedure vital signs reviewed and stable Respiratory status: spontaneous breathing, nonlabored ventilation, respiratory function stable and patient connected to nasal cannula oxygen Cardiovascular status: stable and blood pressure returned to baseline Postop Assessment: no apparent nausea or vomiting Anesthetic complications: no   No notable events documented.   Last Vitals:  Vitals:   01/26/23 1031 01/26/23 1036  BP: 104/66   Pulse: 70 70  Resp: 19 12  Temp: (!) 36.4 C 36.5 C  SpO2: 96% 96%    Last Pain:  Vitals:   01/26/23 1036  TempSrc:   PainSc: 0-No pain                 Artist Bloom C Anitria Andon

## 2023-01-26 NOTE — H&P (Signed)
[facility]   Primary Care Physician:  Lazarus Gowda, MD Ophthalmologist: Dr. Deberah Pelton  Pre-Procedure History & Physical: HPI:  Samantha Blankenship is a 72 y.o. female here for cataract surgery.   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   High cholesterol    History of kidney stones    Hyperlipidemia    Hypertension    Multiple myeloma in remission (HCC)    Neuropathy due to chemotherapeutic drug (HCC)    Paroxysmal atrial fibrillation (HCC)    Seasonal allergies    Stomach ulcer    Vertigo    distant past   Wears dentures    full upper and lower    Past Surgical History:  Procedure Laterality Date   ABDOMINAL HYSTERECTOMY     BLADDER ASPIRATION     BLADDER SURGERY  1999   CATARACT EXTRACTION W/PHACO Left 09/15/2022   Procedure: CATARACT EXTRACTION PHACO AND INTRAOCULAR LENS PLACEMENT (IOC) LEFT omidria  6.80  00:51.3;  Surgeon: Estanislado Pandy, MD;  Location: [facility];  Service: Ophthalmology;  Laterality: Left;   EYE SURGERY     GALLBLADDER SURGERY     HEMORROIDECTOMY     HERNIA REPAIR     KIDNEY STONE SURGERY     TUMOR REMOVAL      Prior to Admission medications   Medication Sig Start Date End Date Taking? Authorizing Provider  acyclovir (ZOVIRAX) 200 MG capsule Take 200 mg by mouth 2 (two) times daily.   Yes [provider]  albuterol (VENTOLIN HFA) 108 (90 Base) MCG/ACT inhaler Inhale 1-2 puffs into the lungs every 4 (four) hours as needed.  05/08/18 01/25/23 Yes [provider]  allopurinol (ZYLOPRIM) 100 MG tablet Take 100 mg by mouth daily.   Yes [provider]  apixaban (ELIQUIS) 2.5 MG TABS tablet Take 2.5 mg by mouth 2 (two) times daily.   Yes [provider]  B Complex Vitamins (VITAMIN B-COMPLEX) TABS Take  1 tablet by mouth daily.    Yes [provider]  Cholecalciferol (VITAMIN D3) 250 MCG (10000 UT) TABS Take 1 tablet by mouth 2 (two) times a day.   Yes [provider]  DULoxetine (CYMBALTA) 30 MG capsule Take 30 mg by mouth daily.   Yes [provider]  fenofibrate micronized (LOFIBRA) 134 MG capsule Take 134 mg by mouth daily before breakfast.   Yes [provider]  furosemide (LASIX) 20 MG tablet Take 20 mg by mouth.   Yes [provider]  gabapentin (NEURONTIN) 300 MG capsule Take 600 mg by mouth 3 (three) times daily.   Yes [provider]  lenalidomide (REVLIMID) 10 MG capsule Take 10 mg by mouth daily with supper. [employer] Auth #     Date Obtained   Yes [provider]  metoprolol tartrate (LOPRESSOR) 25 MG tablet Take 12.5 mg by mouth 2 (two) times daily.   Yes [provider]  pantoprazole (PROTONIX) 40 MG tablet Take 40 mg by mouth 2 (two) times daily.   Yes [provider]  prenatal vitamin w/FE, FA (NATACHEW) 29-1 MG CHEW chewable tablet Chew 1 tablet by mouth daily at 12 noon.   Yes [provider]  simvastatin (ZOCOR) 80 MG tablet Take 80  mg by mouth daily.   Yes [provider]  tamsulosin (FLOMAX) 0.4 MG CAPS capsule Take 0.4 mg by mouth daily.   Yes [provider]    Allergies as of 01/04/2023 - Review Complete 09/15/2022  Allergen Reaction Noted   Aspirin Shortness Of Breath and Swelling 03/20/2012   Carbinoxamine Shortness Of Breath and Swelling 03/20/2012   Cetirizine Shortness Of Breath and Swelling 03/20/2012   Coffea arabica Shortness Of Breath 03/20/2012   Other Shortness Of Breath, Swelling, and Other (See Comments) 03/20/2012   Penicillins Shortness Of Breath, Swelling, and Rash 06/13/2017   Shellfish allergy Shortness Of Breath and Swelling 03/20/2012   Sulfasalazine Palpitations, Rash, and Swelling 12/06/2012   Grapefruit extract Swelling 03/20/2012   Okra  Swelling 03/20/2012   Sulfa antibiotics  06/13/2017   Pseudoephedrine Palpitations 06/16/2011    Family History  Problem Relation Age of Onset   Hypertension Mother    Heart failure Father    Prostate cancer Maternal Grandfather    Breast cancer Neg Hx     Social History   Socioeconomic History   Marital status: Married    Spouse name: Not on file   Number of children: Not on file   Years of education: Not on file   Highest education level: Not on file  Occupational History   Not on file  Tobacco Use   Smoking status: Former   Smokeless tobacco: Never   Tobacco comments:    social smoker over 25 years ago  Vaping Use   Vaping status: Never Used  Substance and Sexual Activity   Alcohol use: Never   Drug use: Never   Sexual activity: Yes  Other Topics Concern   Not on file  Social History Narrative   Not on file   Social Determinants of Health   Financial Resource Strain: High Risk (05/24/2022)   Received from [facility], [facility]   Overall Financial Resource Strain (CARDIA)    Difficulty of Paying Living Expenses: Hard  Food Insecurity: Food Insecurity Present (05/24/2022)   Received from [facility], [facility]   Hunger Vital Sign    Worried About Running Out of Food in the Last Year: Often true    Ran Out of Food in the Last Year: Sometimes true  Transportation Needs: Unmet Transportation Needs (05/24/2022)   Received from [facility], [facility]   PRAPARE - Transportation    In the past 12 months, has lack of transportation kept you from medical appointments or from getting medications?: Yes    Lack of Transportation (Non-Medical): Yes  Physical Activity: Sufficiently Active (05/24/2022)   Received from [facility], [facility]   Exercise Vital Sign    Days of Exercise per Week: 7 days    Minutes of Exercise per  Session: 90 min  Stress: No Stress Concern Present (05/24/2022)   Received from [facility], [facility]   [employer] of Occupational Health - Occupational Stress Questionnaire    Feeling of Stress : Only a little  Social Connections: Moderately Integrated (05/24/2022)   Received from [facility], [facility]   Social Connection and Isolation Panel [NHANES]    Frequency of Communication with Friends and Family: More than three times a week    Frequency of Social Gatherings with Friends and Family: Three times a week    Attends Religious Services:  More than 4 times per year    Active Member of Clubs or Organizations: Yes    Attends Banker Meetings: More than 4 times per year    Marital Status: Widowed  Intimate Partner Violence: Unknown (07/07/2021)   Received from [facility], [facility]   HITS    Physically Hurt: Not on file    Insult or Talk Down To: Not on file    Threaten Physical Harm: Not on file    Scream or Curse: Not on file    Review of Systems: See HPI, otherwise negative ROS  Physical Exam: Ht 5\' 8"  (1.727 m)   Wt 85.7 kg   BMI 28.74 kg/m  General:   Alert, cooperative in NAD Head:  Normocephalic and atraumatic. Respiratory:  Normal work of breathing. Cardiovascular:  RRR  Impression/Plan: Samantha Blankenship is here for cataract surgery.  Risks, benefits, limitations, and alternatives regarding cataract surgery have been reviewed with the patient.  Questions have been answered.  All parties agreeable.   Estanislado Pandy, MD  01/26/2023, 7:01 AM

## 2023-01-26 NOTE — Transfer of Care (Signed)
Immediate Anesthesia Transfer of Care Note  Patient: Samantha Blankenship  Procedure(s) Performed: CATARACT EXTRACTION PHACO AND INTRAOCULAR LENS PLACEMENT (IOC) RIGHT 8.49 00:50.1 (Right: Eye)  Patient Location: PACU  Anesthesia Type:MAC  Level of Consciousness: awake, alert , and oriented  Airway & Oxygen Therapy: Patient Spontanous Breathing  Post-op Assessment: Report given to RN, Post -op Vital signs reviewed and stable, and Patient moving all extremities X 4  Post vital signs: Reviewed and stable  Last Vitals:  Vitals Value Taken Time  BP    Temp    Pulse 70 01/26/23 1032  Resp 19 01/26/23 1032  SpO2 96 % 01/26/23 1032  Vitals shown include unfiled device data.  Last Pain:  Vitals:   01/26/23 1031  TempSrc:   PainSc: (P) 0-No pain         Complications: No notable events documented.

## 2023-05-09 ENCOUNTER — Ambulatory Visit
Admission: EM | Admit: 2023-05-09 | Discharge: 2023-05-09 | Disposition: A | Discharge status: Home / Self Care | Attending: Emergency Medicine | Admitting: Emergency Medicine

## 2023-05-09 DIAGNOSIS — N390 Urinary tract infection, site not specified: Secondary | ICD-10-CM | POA: Diagnosis not present

## 2023-05-09 LAB — URINALYSIS, W/ REFLEX TO CULTURE (INFECTION SUSPECTED)
Bilirubin Urine: NEGATIVE
Glucose, UA: NEGATIVE mg/dL
Ketones, ur: NEGATIVE mg/dL
Nitrite: POSITIVE — AB
Protein, ur: 100 mg/dL — AB
Specific Gravity, Urine: 1.02 (ref 1.005–1.030)
WBC, UA: >50 WBC/hpf (ref 0–5)
pH: 7 (ref 5.0–8.0)

## 2023-05-09 MED ORDER — PHENAZOPYRIDINE HCL 200 MG PO TABS: 200.0000 mg | ORAL_TABLET | Freq: Three times a day (TID) | ORAL | 0 refills | Status: DC

## 2023-05-09 MED ORDER — NITROFURANTOIN MONOHYD MACRO 100 MG PO CAPS: 100.0000 mg | ORAL_CAPSULE | Freq: Two times a day (BID) | ORAL | 0 refills | Status: DC

## 2023-05-09 NOTE — ED Triage Notes (Signed)
 Sx x 2 days  Urinary frequency Burning with urination.

## 2023-05-09 NOTE — Discharge Instructions (Addendum)

## 2023-05-09 NOTE — ED Provider Notes (Signed)
 [facility]    CSN: [id number] Arrival date & time: 05/09/23  0844      History   Chief Complaint Chief Complaint  Patient presents with   Urinary Frequency   Dysuria    HPI Samantha Blankenship is a 73 y.o. female.   HPI  73 year old female with past medical history significant for hypertension, high cholesterol, anxiety, GERD, and arthritis presents for evaluation of UTI symptoms that began 2 days ago.  She endorses burning with urination as well as suprapubic pain and urinary frequency.  She has not seen any blood in her urine and she denies fever or low back pain.  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   High cholesterol    History of kidney stones    Hyperlipidemia    Hypertension    Multiple myeloma in remission (HCC)    Neuropathy due to chemotherapeutic drug (HCC)    Paroxysmal atrial fibrillation (HCC)    Seasonal allergies    Stomach ulcer    Vertigo    distant past   Wears dentures    full upper and lower    Patient Active Problem List   Diagnosis Date Noted   Other fatigue 09/18/2018   Monoclonal gammopathy 09/17/2018   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   Normocytic anemia 09/08/2018   Kidney stones 08/11/2017   Bladder cancer (HCC) 08/11/2017   Osteoarthritis 08/11/2017   Seasonal allergies 08/11/2017   Prediabetes 06/21/2017   AKI (acute kidney injury) (HCC) 03/08/2017   Left ureteral stone 03/08/2017   Pharyngoesophageal dysphagia 09/16/2014   Gastro-esophageal reflux disease without esophagitis 03/04/2014   Increased frequency of urination 03/20/2012   Lesion of bladder 03/20/2012   Urinary urgency 03/20/2012   Essential hypertension 06/16/2011   Hyperlipidemia 06/16/2011    Past Surgical History:  Procedure  Laterality Date   ABDOMINAL HYSTERECTOMY     BLADDER ASPIRATION     BLADDER SURGERY  1999   CATARACT EXTRACTION W/PHACO Left 09/15/2022   Procedure: CATARACT EXTRACTION PHACO AND INTRAOCULAR LENS PLACEMENT (IOC) LEFT omidria  6.80  00:51.3;  Surgeon: Estanislado Pandy, MD;  Location: [facility];  Service: Ophthalmology;  Laterality: Left;   CATARACT EXTRACTION W/PHACO Right 01/26/2023   Procedure: CATARACT EXTRACTION PHACO AND INTRAOCULAR LENS PLACEMENT (IOC) RIGHT 8.49 00:50.1;  Surgeon: Estanislado Pandy, MD;  Location: [facility];  Service: Ophthalmology;  Laterality: Right;   EYE SURGERY     GALLBLADDER SURGERY     HEMORROIDECTOMY     HERNIA REPAIR     KIDNEY STONE SURGERY     TUMOR REMOVAL      OB History   No obstetric history on file.      Home Medications    Prior to Admission medications   Medication Sig Start Date End Date Taking? Authorizing Provider  acyclovir (ZOVIRAX) 200 MG capsule Take 200 mg by mouth 2 (two) times daily.   Yes [provider]  allopurinol (ZYLOPRIM) 100 MG tablet Take 100 mg by mouth daily.   Yes [provider]  apixaban (ELIQUIS) 2.5 MG TABS tablet Take 2.5 mg by mouth 2 (two) times daily.   Yes [provider]  B Complex Vitamins (VITAMIN B-COMPLEX) TABS Take 1 tablet  by mouth daily.    Yes [provider]  Cholecalciferol (VITAMIN D3) 250 MCG (10000 UT) TABS Take 1 tablet by mouth 2 (two) times a day.   Yes [provider]  DULoxetine (CYMBALTA) 30 MG capsule Take 30 mg by mouth daily.   Yes [provider]  fenofibrate micronized (LOFIBRA) 134 MG capsule Take 134 mg by mouth daily before breakfast.   Yes [provider]  furosemide (LASIX) 20 MG tablet Take 20 mg by mouth.   Yes [provider]  gabapentin (NEURONTIN) 300 MG capsule Take 600 mg by mouth 3 (three) times daily.   Yes [provider]  lenalidomide (REVLIMID) 10 MG capsule  Take 10 mg by mouth daily with supper. [employer] Auth #     Date Obtained   Yes [provider]  metoprolol tartrate (LOPRESSOR) 25 MG tablet Take 12.5 mg by mouth 2 (two) times daily.   Yes [provider]  nitrofurantoin, macrocrystal-monohydrate, (MACROBID) 100 MG capsule Take 1 capsule (100 mg total) by mouth 2 (two) times daily. 05/09/23  Yes Becky Augusta, NP  pantoprazole (PROTONIX) 40 MG tablet Take 40 mg by mouth 2 (two) times daily.   Yes [provider]  phenazopyridine (PYRIDIUM) 200 MG tablet Take 1 tablet (200 mg total) by mouth 3 (three) times daily. 05/09/23  Yes Becky Augusta, NP  prenatal vitamin w/FE, FA (NATACHEW) 29-1 MG CHEW chewable tablet Chew 1 tablet by mouth daily at 12 noon.   Yes [provider]  simvastatin (ZOCOR) 80 MG tablet Take 80 mg by mouth daily.   Yes [provider]  tamsulosin (FLOMAX) 0.4 MG CAPS capsule Take 0.4 mg by mouth daily.   Yes [provider]  albuterol (VENTOLIN HFA) 108 (90 Base) MCG/ACT inhaler Inhale 1-2 puffs into the lungs every 4 (four) hours as needed.  05/08/18 01/25/23  [provider]    Family History Family History  Problem Relation Age of Onset   Hypertension Mother    Heart failure Father    Prostate cancer Maternal Grandfather    Breast cancer Neg Hx     Social History Social History   Tobacco Use   Smoking status: Former   Smokeless tobacco: Never   Tobacco comments:    social smoker over 25 years ago  Vaping Use   Vaping status: Never Used  Substance Use Topics   Alcohol use: Never   Drug use: Never     Allergies   Aspirin, Carbinoxamine, Ceftriaxone, Cetirizine, Coffea arabica, Other, Penicillins, Pseudoephedrine, Shellfish allergy, Sulfa antibiotics, Sulfasalazine, Doxycycline monohydrate, Grapefruit extract, Okra, Pregabalin, and Tape   Review of Systems Review of Systems  Constitutional:  Negative for fever.  Gastrointestinal:  Positive for  abdominal pain.       Suprapubic pain  Genitourinary:  Positive for dysuria, frequency and urgency. Negative for hematuria.  Musculoskeletal:  Negative for back pain.     Physical Exam Triage Vital Signs ED Triage Vitals  Encounter Vitals Group     BP      Systolic BP Percentile      Diastolic BP Percentile      Pulse      Resp      Temp      Temp src      SpO2      Weight      Height      Head Circumference      Peak Flow      Pain Score  Pain Loc      Pain Education      Exclude from Growth Chart    No data found.  Updated Vital Signs BP (!) 152/77 (BP Location: Right Arm)   Pulse 77   Temp 98.3 F (36.8 C) (Oral)   Resp 17   SpO2 96%   Visual Acuity Right Eye Distance:   Left Eye Distance:   Bilateral Distance:    Right Eye Near:   Left Eye Near:    Bilateral Near:     Physical Exam Vitals and nursing note reviewed.  Constitutional:      Appearance: Normal appearance.  Cardiovascular:     Rate and Rhythm: Normal rate and regular rhythm.     Pulses: Normal pulses.     Heart sounds: Normal heart sounds. No murmur heard.    No friction rub. No gallop.  Pulmonary:     Effort: Pulmonary effort is normal.     Breath sounds: Normal breath sounds. No wheezing, rhonchi or rales.  Abdominal:     Palpations: Abdomen is soft.     Tenderness: There is no abdominal tenderness. There is no right CVA tenderness, left CVA tenderness, guarding or rebound.  Skin:    General: Skin is warm and dry.     Capillary Refill: Capillary refill takes less than 2 seconds.     Findings: No rash.  Neurological:     General: No focal deficit present.     Mental Status: She is alert and oriented to person, place, and time.      UC Treatments / Results  Labs (all labs ordered are listed, but only abnormal results are displayed) Labs Reviewed  URINALYSIS, W/ REFLEX TO CULTURE (INFECTION SUSPECTED) - Abnormal; Notable for the following components:      Result Value    Color, Urine STRAW (*)    APPearance TURBID (*)    Hgb urine dipstick SMALL (*)    Protein, ur 100 (*)    Nitrite POSITIVE (*)    Leukocytes,Ua MODERATE (*)    Bacteria, UA MANY (*)    All other components within normal limits  URINE CULTURE    EKG   Radiology No results found.  Procedures Procedures (including critical care time)  Medications Ordered in UC Medications - No data to display  Initial Impression / Assessment and Plan / UC Course  I have reviewed the triage vital signs and the nursing notes.  Pertinent labs & imaging results that were available during my care of the patient were reviewed by me and considered in my medical decision making (see chart for details).   Patient is a nontoxic-appearing 44 old female presenting for evaluation of UTI symptoms outlined HPI above.  Symptoms began 2 days ago and she is endorsing burning with urination as well as urgency and frequency.  She has no CVA tenderness on exam.  Abdomen is soft and nontender.  I will order urinalysis to evaluate the presence of UTI.  Urinalysis is strong color with a turbid appearance, small hemoglobin noted.  100 protein, nitrite positive, moderate leukocyte esterase.  Reflex microscopy shows greater than 50 WBCs, 6-10 RBCs, and many bacteria.  Urine will reflex to culture.  I will discharge patient with diagnosis of UTI with a prescription for Macrobid 100 mg twice daily for 5 days as she has allergies to significant number of antibiotics including cephalosporins, penicillins, sulfa, and tetracyclines.  I will also prescribe Pyridium that she can take every 8 hours as  needed for urinary discomfort.  We will send urine for culture and adjust antibiotic therapy as needed based upon her culture results and allergy list.  Return precautions reviewed.   Final Clinical Impressions(s) / UC Diagnoses   Final diagnoses:  Lower urinary tract infectious disease     Discharge Instructions      Take the  Macrobid twice daily for 5 days with food for treatment of urinary tract infection.  Use the Pyridium every 8 hours as needed for urinary discomfort.  This will turn your urine a bright red-orange.  Increase your oral fluid intake so that you increase your urine production and or flushing your urinary system.  Take an over-the-counter probiotic, such as Culturelle-Align-Activia, 1 hour after each dose of antibiotic to prevent diarrhea or yeast infections from forming.  We will culture urine and change the antibiotics if necessary.  Return for reevaluation, or see your primary care provider, for any new or worsening symptoms.      ED Prescriptions     Medication Sig Dispense Auth. Provider   nitrofurantoin, macrocrystal-monohydrate, (MACROBID) 100 MG capsule Take 1 capsule (100 mg total) by mouth 2 (two) times daily. 10 capsule Becky Augusta, NP   phenazopyridine (PYRIDIUM) 200 MG tablet Take 1 tablet (200 mg total) by mouth 3 (three) times daily. 6 tablet Becky Augusta, NP      PDMP not reviewed this encounter.   Becky Augusta, NP 05/09/23 1002

## 2023-05-11 LAB — URINE CULTURE: Culture: >=100000 — AB

## 2023-06-01 ENCOUNTER — Ambulatory Visit
Admission: EM | Admit: 2023-06-01 | Discharge: 2023-06-01 | Disposition: A | Discharge status: Home / Self Care | Attending: Emergency Medicine | Admitting: Emergency Medicine

## 2023-06-01 ENCOUNTER — Encounter: Payer: Self-pay | Admitting: Emergency Medicine

## 2023-06-01 DIAGNOSIS — N390 Urinary tract infection, site not specified: Secondary | ICD-10-CM | POA: Diagnosis not present

## 2023-06-01 LAB — URINALYSIS, W/ REFLEX TO CULTURE (INFECTION SUSPECTED)
Bilirubin Urine: NEGATIVE
Glucose, UA: NEGATIVE mg/dL
Ketones, ur: NEGATIVE mg/dL
Nitrite: POSITIVE — AB
Protein, ur: 100 mg/dL — AB
Specific Gravity, Urine: 1.015 (ref 1.005–1.030)
pH: 6 (ref 5.0–8.0)

## 2023-06-01 MED ORDER — FOSFOMYCIN TROMETHAMINE 3 G PO PACK: 3.0000 g | PACK | Freq: Once | ORAL | 0 refills | Status: AC

## 2023-06-01 MED ORDER — PHENAZOPYRIDINE HCL 200 MG PO TABS: 200.0000 mg | ORAL_TABLET | Freq: Three times a day (TID) | ORAL | 0 refills | Status: AC

## 2023-06-01 NOTE — Discharge Instructions (Addendum)
 Your urinalysis shows that you have a urinary tract infection.  We are going to send the urine for culture.  When you had a UTI earlier this month your urine grew out as a Rika coli which was resistant to many antibiotics except for sulfa, which you have a documented allergy to.  Take the fosfomycin 3 g as a one-time dose for treatment of urinary tract infection.  Use the Pyridium every 8 hours as needed for urinary discomfort.  This will turn your urine a bright red-orange.  Increase your oral fluid intake so that you increase your urine production and or flushing your urinary system.  Take an over-the-counter probiotic, such as Culturelle-Align-Activia, 1 hour after each dose of antibiotic to prevent diarrhea or yeast infections from forming.  We will culture urine and change the antibiotics if necessary.  Schedule follow-up appointment with your primary care provider within the next week to be reevaluated and have a repeat urinalysis to ensure that your UTI has resolved.  Return for reevaluation, or see your primary care provider, for any new or worsening symptoms.

## 2023-06-01 NOTE — ED Provider Notes (Signed)
 [facility]    CSN: [id number] Arrival date & time: 06/01/23  2952      History   Chief Complaint Chief Complaint  Patient presents with   Urinary Tract Infection    HPI Samantha Blankenship is a 73 y.o. female.   HPI  72 year old female with past medical history significant for essential hypertension, GERD, hyperlipidemia, acute kidney injury, bladder cancer, osteoarthritis, prediabetes, normocytic anemia, clonal gammopathy, elevated serum immunoglobulin free light chains, and multiple myeloma status post bone marrow transplant presents for evaluation of painful urination with increased urinary frequency, urgency, increased nocturia, and urinary incontinence that started 2 days ago.  She reports that she has been experiencing diarrhea for the past week and that whenever she gets diarrhea she always comes down with urinary problems.  She denies any fever, low back pain, nausea or vomiting, or blood in her urine.  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   High cholesterol    History of kidney stones    Hyperlipidemia    Hypertension    Multiple myeloma in remission (HCC)    Neuropathy due to chemotherapeutic drug (HCC)    Paroxysmal atrial fibrillation (HCC)    Seasonal allergies    Stomach ulcer    Vertigo    distant past   Wears dentures    full upper and lower    Patient Active Problem List   Diagnosis Date Noted   Other fatigue 09/18/2018   Monoclonal gammopathy 09/17/2018   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   Normocytic anemia 09/08/2018   Kidney stones 08/11/2017   Bladder cancer (HCC) 08/11/2017   Osteoarthritis 08/11/2017   Seasonal allergies 08/11/2017   Prediabetes 06/21/2017   AKI (acute kidney injury) (HCC) 03/08/2017   Left ureteral  stone 03/08/2017   Pharyngoesophageal dysphagia 09/16/2014   Gastro-esophageal reflux disease without esophagitis 03/04/2014   Increased frequency of urination 03/20/2012   Lesion of bladder 03/20/2012   Urinary urgency 03/20/2012   Essential hypertension 06/16/2011   Hyperlipidemia 06/16/2011    Past Surgical History:  Procedure Laterality Date   ABDOMINAL HYSTERECTOMY     BLADDER ASPIRATION     BLADDER SURGERY  1999   CATARACT EXTRACTION W/PHACO Left 09/15/2022   Procedure: CATARACT EXTRACTION PHACO AND INTRAOCULAR LENS PLACEMENT (IOC) LEFT omidria  6.80  00:51.3;  Surgeon: Estanislado Pandy, MD;  Location: [facility];  Service: Ophthalmology;  Laterality: Left;   CATARACT EXTRACTION W/PHACO Right 01/26/2023   Procedure: CATARACT EXTRACTION PHACO AND INTRAOCULAR LENS PLACEMENT (IOC) RIGHT 8.49 00:50.1;  Surgeon: Estanislado Pandy, MD;  Location: [facility];  Service: Ophthalmology;  Laterality: Right;   EYE SURGERY     GALLBLADDER SURGERY     HEMORROIDECTOMY     HERNIA REPAIR     KIDNEY STONE SURGERY     TUMOR REMOVAL      OB History   No obstetric history on file.      Home Medications    Prior to Admission medications   Medication Sig Start Date End Date Taking? Authorizing Provider  allopurinol (ZYLOPRIM) 100 MG tablet Take 100 mg by mouth daily.   Yes [provider]  apixaban (ELIQUIS) 2.5 MG TABS tablet Take 2.5 mg by mouth 2 (  two) times daily.   Yes [provider]  B Complex Vitamins (VITAMIN B-COMPLEX) TABS Take 1 tablet by mouth daily.    Yes [provider]  Cholecalciferol (VITAMIN D3) 250 MCG (10000 UT) TABS Take 1 tablet by mouth 2 (two) times a day.   Yes [provider]  DULoxetine (CYMBALTA) 30 MG capsule Take 30 mg by mouth daily.   Yes [provider]  fenofibrate micronized (LOFIBRA) 134 MG capsule Take 134 mg by mouth daily before breakfast.   Yes [provider]   fosfomycin (MONUROL) 3 g PACK Take 3 g by mouth once for 1 dose. 06/01/23 06/01/23 Yes Becky Augusta, NP  furosemide (LASIX) 20 MG tablet Take 20 mg by mouth.   Yes [provider]  gabapentin (NEURONTIN) 300 MG capsule Take 600 mg by mouth 3 (three) times daily.   Yes [provider]  lenalidomide (REVLIMID) 10 MG capsule Take 10 mg by mouth daily with supper. [employer] Auth #     Date Obtained   Yes [provider]  metoprolol tartrate (LOPRESSOR) 25 MG tablet Take 12.5 mg by mouth 2 (two) times daily.   Yes [provider]  pantoprazole (PROTONIX) 40 MG tablet Take 40 mg by mouth 2 (two) times daily.   Yes [provider]  phenazopyridine (PYRIDIUM) 200 MG tablet Take 1 tablet (200 mg total) by mouth 3 (three) times daily. 06/01/23  Yes Becky Augusta, NP  prenatal vitamin w/FE, FA (NATACHEW) 29-1 MG CHEW chewable tablet Chew 1 tablet by mouth daily at 12 noon.   Yes [provider]  simvastatin (ZOCOR) 80 MG tablet Take 80 mg by mouth daily.   Yes [provider]  tamsulosin (FLOMAX) 0.4 MG CAPS capsule Take 0.4 mg by mouth daily.   Yes [provider]  albuterol (VENTOLIN HFA) 108 (90 Base) MCG/ACT inhaler Inhale 1-2 puffs into the lungs every 4 (four) hours as needed.  05/08/18 01/25/23  [provider]    Family History Family History  Problem Relation Age of Onset   Hypertension Mother    Heart failure Father    Prostate cancer Maternal Grandfather    Breast cancer Neg Hx     Social History Social History   Tobacco Use   Smoking status: Former   Smokeless tobacco: Never   Tobacco comments:    social smoker over 25 years ago  Vaping Use   Vaping status: Never Used  Substance Use Topics   Alcohol use: Never   Drug use: Never     Allergies   Aspirin, Carbinoxamine, Ceftriaxone, Cetirizine, Coffea arabica, Other, Penicillins, Pseudoephedrine, Shellfish allergy, Sulfa antibiotics, Sulfasalazine,  Doxycycline monohydrate, Grapefruit extract, Okra, Pregabalin, and Tape   Review of Systems Review of Systems  Constitutional:  Negative for fever.  Gastrointestinal:  Positive for diarrhea. Negative for abdominal pain, nausea and vomiting.  Genitourinary:  Positive for dysuria, frequency and urgency. Negative for hematuria.  Musculoskeletal:  Negative for back pain.     Physical Exam Triage Vital Signs ED Triage Vitals  Encounter Vitals Group     BP      Systolic BP Percentile      Diastolic BP Percentile      Pulse      Resp      Temp      Temp src      SpO2      Weight      Height      Head Circumference  Peak Flow      Pain Score      Pain Loc      Pain Education      Exclude from Growth Chart    No data found.  Updated Vital Signs BP (!) 119/52 (BP Location: Left Arm)   Pulse 63   Temp 98 F (36.7 C) (Oral)   Resp 16   Ht 5\' 7"  (1.702 m)   Wt 185 lb (83.9 kg)   SpO2 100%   BMI 28.98 kg/m   Visual Acuity Right Eye Distance:   Left Eye Distance:   Bilateral Distance:    Right Eye Near:   Left Eye Near:    Bilateral Near:     Physical Exam Vitals and nursing note reviewed.  Constitutional:      Appearance: Normal appearance. She is not ill-appearing.  Cardiovascular:     Rate and Rhythm: Normal rate and regular rhythm.     Pulses: Normal pulses.     Heart sounds: Normal heart sounds. No murmur heard.    No friction rub. No gallop.  Pulmonary:     Effort: Pulmonary effort is normal.     Breath sounds: Normal breath sounds. No wheezing, rhonchi or rales.  Abdominal:     Tenderness: There is no right CVA tenderness or left CVA tenderness.  Skin:    General: Skin is warm and dry.     Capillary Refill: Capillary refill takes less than 2 seconds.     Findings: No rash.  Neurological:     General: No focal deficit present.     Mental Status: She is alert and oriented to person, place, and time.      UC Treatments / Results  Labs (all  labs ordered are listed, but only abnormal results are displayed) Labs Reviewed  URINALYSIS, W/ REFLEX TO CULTURE (INFECTION SUSPECTED) - Abnormal; Notable for the following components:      Result Value   APPearance CLOUDY (*)    Hgb urine dipstick MODERATE (*)    Protein, ur 100 (*)    Nitrite POSITIVE (*)    Leukocytes,Ua SMALL (*)    Bacteria, UA FEW (*)    All other components within normal limits  URINE CULTURE    EKG   Radiology No results found.  Procedures Procedures (including critical care time)  Medications Ordered in UC Medications - No data to display  Initial Impression / Assessment and Plan / UC Course  I have reviewed the triage vital signs and the nursing notes.  Pertinent labs & imaging results that were available during my care of the patient were reviewed by me and considered in my medical decision making (see chart for details).   Patient is a nontoxic-appearing 69 old female presenting for evaluation of UTI symptoms outlined HPI above.  She has been unable to provide a urine specimen, though she is drinking water to hopefully be able to provide a specimen.  Patient has no CVA tenderness on exam.  I will order urinalysis to evaluate for the presence of UTI.  Urinalysis shows cloudy appearance with moderate hemoglobin, 100 protein, nitrite positive with small leukocyte esterase.  Reflex microscopy shows 21-50 WBCs, 21-50 RBCs, and few bacteria.  Urine will reflex to culture.  Patient is most recent UTI was earlier this month.  At that time her urine grew out E. coli with significant resistance to penicillins, cephalosporins, fluoroquinolones, and nitrofurantoin.  It was sensitive to Cipro.  However, patient has allergies to cephalosporins,  penicillins, sulfa medications, and tetracyclines.  Patient was treated with fosfomycin at that time so I will send a prescription for 3 g fosfomycin as a one-time dose to the patient's pharmacy.   Final Clinical  Impressions(s) / UC Diagnoses   Final diagnoses:  Lower urinary tract infectious disease     Discharge Instructions      Your urinalysis shows that you have a urinary tract infection.  We are going to send the urine for culture.  When you had a UTI earlier this month your urine grew out as a Rika coli which was resistant to many antibiotics except for sulfa, which you have a documented allergy to.  Take the fosfomycin 3 g as a one-time dose for treatment of urinary tract infection.  Use the Pyridium every 8 hours as needed for urinary discomfort.  This will turn your urine a bright red-orange.  Increase your oral fluid intake so that you increase your urine production and or flushing your urinary system.  Take an over-the-counter probiotic, such as Culturelle-Align-Activia, 1 hour after each dose of antibiotic to prevent diarrhea or yeast infections from forming.  We will culture urine and change the antibiotics if necessary.  Schedule follow-up appointment with your primary care provider within the next week to be reevaluated and have a repeat urinalysis to ensure that your UTI has resolved.  Return for reevaluation, or see your primary care provider, for any new or worsening symptoms.      ED Prescriptions     Medication Sig Dispense Auth. Provider   fosfomycin (MONUROL) 3 g PACK Take 3 g by mouth once for 1 dose. 3 g Becky Augusta, NP   phenazopyridine (PYRIDIUM) 200 MG tablet Take 1 tablet (200 mg total) by mouth 3 (three) times daily. 6 tablet Becky Augusta, NP      PDMP not reviewed this encounter.   Becky Augusta, NP 06/01/23 1121

## 2023-06-01 NOTE — ED Triage Notes (Signed)
 Pt c/o loss of bladder control x2days  Pt states that she had bone cancer 2 years ago and had a bone transplant.   Pt states that she has urinary problems every time she has diarrhea and had diarrhea 1 week ago

## 2023-06-04 LAB — URINE CULTURE: Culture: >=100000 — AB

## 2023-08-31 ENCOUNTER — Ambulatory Visit: Admission: EM | Admit: 2023-08-31 | Discharge: 2023-08-31 | Disposition: A | Discharge status: Home / Self Care

## 2023-08-31 ENCOUNTER — Encounter: Payer: Self-pay | Admitting: Emergency Medicine

## 2023-08-31 DIAGNOSIS — R509 Fever, unspecified: Secondary | ICD-10-CM | POA: Diagnosis not present

## 2023-08-31 DIAGNOSIS — R7981 Abnormal blood-gas level: Secondary | ICD-10-CM

## 2023-08-31 DIAGNOSIS — M25522 Pain in left elbow: Secondary | ICD-10-CM

## 2023-08-31 NOTE — Discharge Instructions (Signed)
-  Needs workup for sepsis. She could have a urinary infection, pneumonia, COVID/flu, elbow infection. -Please go immediately to ER  You have been advised to follow up immediately in the emergency department for concerning signs.symptoms. If you declined EMS transport, please have a family member take you directly to the ED at this time. Do not delay. Based on concerns about condition, if you do not follow up in th e ED, you may risk poor outcomes including worsening of condition, delayed treatment and potentially life threatening issues. If you have declined to go to the ED at this time, you should call your PCP immediately to set up a follow up appointment.  Go to ED for red flag symptoms, including; fevers you cannot reduce with Tylenol/Motrin, severe headaches, vision changes, numbness/weakness in part of the body, lethargy, confusion, intractable vomiting, severe dehydration, chest pain, breathing difficulty, severe persistent abdominal or pelvic pain, signs of severe infection (increased redness, swelling of an area), feeling faint or passing out, dizziness, etc. You should especially go to the ED for sudden acute worsening of condition if you do not elect to go at this time.

## 2023-08-31 NOTE — ED Triage Notes (Signed)
 Pt presents with left elbow pain for over 1 week. She had an xray performed at [facility] on 6/16 but is not better. She has taken ibuprofen for the pain.

## 2023-08-31 NOTE — ED Provider Notes (Signed)
 [facility]    CSN: [id number] Arrival date & time: 08/31/23  1839      History   Chief Complaint Chief Complaint  Patient presents with   Elbow Pain   Chills    HPI Samantha Blankenship is a 73 y.o. female presenting for onset of feeling feverish in the past couple days.  Also reports greater than a week and a half history of left elbow pain.  Pain comes and goes and is worse with movement.  She was seen at a different urgent care when this occurred and had an x-ray which was negative.  She says the pain has not gotten any better or worse from then.  Does not know if that is related to current fever.  Denies cough, congestion, sore throat, chest pain, shortness of breath, abdominal pain, nausea/vomiting, diarrhea, constipation, dysuria, frequency, urgency, headaches, body aches.  Took ibuprofen several hours ago for discomfort.  Current temperature 101.1 degrees.  Medical history significant for bladder cancer, GERD, gout, multiple myeloma in remission, vertigo, anxiety.  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   High cholesterol    History of kidney stones    Hyperlipidemia    Hypertension    Multiple myeloma in remission (HCC)    Neuropathy due to chemotherapeutic drug (HCC)    Paroxysmal atrial fibrillation (HCC)    Seasonal allergies    Stomach ulcer    Vertigo    distant past   Wears dentures    full upper and lower    Patient Active Problem List   Diagnosis Date Noted   Other fatigue 09/18/2018   Monoclonal gammopathy 09/17/2018   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   Normocytic anemia 09/08/2018   Kidney stones 08/11/2017   Bladder cancer (HCC) 08/11/2017   Osteoarthritis 08/11/2017   Seasonal allergies 08/11/2017   Prediabetes  06/21/2017   AKI (acute kidney injury) (HCC) 03/08/2017   Left ureteral stone 03/08/2017   Pharyngoesophageal dysphagia 09/16/2014   Gastro-esophageal reflux disease without esophagitis 03/04/2014   Increased frequency of urination 03/20/2012   Lesion of bladder 03/20/2012   Urinary urgency 03/20/2012   Essential hypertension 06/16/2011   Hyperlipidemia 06/16/2011    Past Surgical History:  Procedure Laterality Date   ABDOMINAL HYSTERECTOMY     BLADDER ASPIRATION     BLADDER SURGERY  1999   CATARACT EXTRACTION W/PHACO Left 09/15/2022   Procedure: CATARACT EXTRACTION PHACO AND INTRAOCULAR LENS PLACEMENT (IOC) LEFT omidria   6.80  00:51.3;  Surgeon: Enola Feliciano Hugger, MD;  Location: [facility];  Service: Ophthalmology;  Laterality: Left;   CATARACT EXTRACTION W/PHACO Right 01/26/2023   Procedure: CATARACT EXTRACTION PHACO AND INTRAOCULAR LENS PLACEMENT (IOC) RIGHT 8.49 00:50.1;  Surgeon: Enola Feliciano Hugger, MD;  Location: [facility];  Service: Ophthalmology;  Laterality: Right;   EYE SURGERY     GALLBLADDER SURGERY     HEMORROIDECTOMY     HERNIA REPAIR     KIDNEY STONE SURGERY     TUMOR REMOVAL      OB History   No obstetric history on file.      Home Medications    Prior to Admission medications   Medication Sig Start Date End Date Taking? Authorizing Provider  albuterol (VENTOLIN HFA) 108 (90 Base) MCG/ACT inhaler Inhale 1-2 puffs into the lungs every 4 (four) hours as needed.  05/08/18 01/25/23  [provider]  allopurinol (ZYLOPRIM) 100 MG tablet Take 100 mg by mouth daily.    [provider]  apixaban (ELIQUIS) 2.5 MG TABS tablet Take 2.5 mg by mouth 2 (two) times daily.    [provider]  B Complex Vitamins (VITAMIN B-COMPLEX) TABS Take 1 tablet by mouth daily.     [provider]  Cholecalciferol (VITAMIN D3) 250 MCG (10000 UT) TABS Take 1 tablet by mouth 2 (two) times a day.    [provider]   DULoxetine (CYMBALTA) 30 MG capsule Take 30 mg by mouth daily.    [provider]  fenofibrate micronized (LOFIBRA) 134 MG capsule Take 134 mg by mouth daily before breakfast.    [provider]  furosemide (LASIX) 20 MG tablet Take 20 mg by mouth.    [provider]  gabapentin (NEURONTIN) 300 MG capsule Take 600 mg by mouth 3 (three) times daily.    [provider]  lenalidomide (REVLIMID) 10 MG capsule Take 10 mg by mouth daily with supper. [employer] Auth #     Date Obtained    [provider]  metoprolol tartrate (LOPRESSOR) 25 MG tablet Take 12.5 mg by mouth 2 (two) times daily.    [provider]  pantoprazole (PROTONIX) 40 MG tablet Take 40 mg by mouth 2 (two) times daily.    [provider]  phenazopyridine  (PYRIDIUM ) 200 MG tablet Take 1 tablet (200 mg total) by mouth 3 (three) times daily. 06/01/23   Bernardino Ditch, NP  prenatal vitamin w/FE, FA (NATACHEW) 29-1 MG CHEW chewable tablet Chew 1 tablet by mouth daily at 12 noon.    [provider]  simvastatin (ZOCOR) 80 MG tablet Take 80 mg by mouth daily.    [provider]  tamsulosin (FLOMAX) 0.4 MG CAPS capsule Take 0.4 mg by mouth daily.    [provider]    Family History Family History  Problem Relation Age of Onset   Hypertension Mother    Heart failure Father    Prostate cancer Maternal Grandfather    Breast cancer Neg Hx     Social History Social History   Tobacco Use   Smoking status: Former   Smokeless tobacco: Never   Tobacco comments:    social smoker over 25 years ago  Vaping Use   Vaping status: Never Used  Substance Use Topics   Alcohol use: Never   Drug use: Never     Allergies   Aspirin, Carbinoxamine, Ceftriaxone, Cetirizine, Coffea arabica, Other, Penicillins, Pseudoephedrine, Shellfish allergy, Sulfa antibiotics, Sulfasalazine, Doxycycline monohydrate, Grapefruit extract, Okra, Pregabalin, and Tape   Review of  Systems Review of Systems  Constitutional:  Positive for chills, fatigue and fever. Negative for diaphoresis.  HENT:  Negative for congestion, ear pain, rhinorrhea and sore throat.   Respiratory:  Negative for cough and shortness of breath.   Cardiovascular:  Negative for chest pain.  Gastrointestinal:  Negative for abdominal pain, nausea and vomiting.  Genitourinary:  Negative for difficulty urinating, dysuria, frequency and urgency.  Musculoskeletal:  Positive for arthralgias and joint swelling. Negative for myalgias.  Skin:  Negative for rash.  Neurological:  Negative for dizziness, weakness and headaches.  Hematological:  Negative for adenopathy.     Physical Exam Triage Vital Signs ED Triage Vitals  Encounter Vitals Group     BP  08/31/23 1855 133/71     Girls Systolic BP Percentile --      Girls Diastolic BP Percentile --      Boys Systolic BP Percentile --      Boys Diastolic BP Percentile --      Pulse Rate 08/31/23 1854 96     Resp 08/31/23 1855 18     Temp 08/31/23 1854 (!) 101.1 F (38.4 C)     Temp Source 08/31/23 1854 Oral     SpO2 08/31/23 1855 93 %     Weight --      Height --      Head Circumference --      Peak Flow --      Pain Score 08/31/23 1852 6     Pain Loc --      Pain Education --      Exclude from Growth Chart --    No data found.  Updated Vital Signs BP 133/71 (BP Location: Right Arm)   Pulse 96   Temp (!) 101.1 F (38.4 C) (Oral)   Resp 18   SpO2 93%   Physical Exam Vitals and nursing note reviewed.  Constitutional:      General: She is not in acute distress.    Appearance: Normal appearance. She is ill-appearing. She is not toxic-appearing.  HENT:     Head: Normocephalic and atraumatic.     Nose: Nose normal.     Mouth/Throat:     Mouth: Mucous membranes are moist.     Pharynx: Oropharynx is clear.   Eyes:     General: No scleral icterus.       Right eye: No discharge.        Left eye: No discharge.     Conjunctiva/sclera:  Conjunctivae normal.    Cardiovascular:     Rate and Rhythm: Normal rate and regular rhythm.     Heart sounds: Normal heart sounds.  Pulmonary:     Effort: Pulmonary effort is normal. No respiratory distress.     Breath sounds: Normal breath sounds.  Abdominal:     Palpations: Abdomen is soft.     Tenderness: There is abdominal tenderness (LLQ). There is no right CVA tenderness, left CVA tenderness, guarding or rebound.   Musculoskeletal:     Cervical back: Neck supple.     Comments: LEFT ELBOW: No swelling or tenderness. No wounds, erythema. Full ROM but pain with full extension and full flexion   Skin:    General: Skin is dry.   Neurological:     General: No focal deficit present.     Mental Status: She is alert. Mental status is at baseline.     Motor: No weakness.     Gait: Gait normal.   Psychiatric:        Mood and Affect: Mood normal.        Behavior: Behavior normal.      UC Treatments / Results  Labs (all labs ordered are listed, but only abnormal results are displayed) Labs Reviewed - No data to display  EKG   Radiology No results found.  Procedures Procedures (including critical care time)  Medications Ordered in UC Medications - No data to display  Initial Impression / Assessment and Plan / UC Course  I have reviewed the triage vital signs and the nursing notes.  Pertinent labs & imaging results that were available during my care of the patient were reviewed by me and considered in my medical decision making (see  chart for details).   73 year old female presents for evaluation of fever over the past couple days.  Denies any cough, congestion, sore throat, chest pain, shortness of breath, abdominal pain, urinary symptoms, diarrhea, constipation.  No COVID or flu exposure. Left elbow pain >1.5 week after injury. Reviewed previous UC note and xray results which were negative for fracture 10 days ago.   Current temperature 101.1 degrees.  Oxygen ranges  between 91 and 93%.  She is not in any respiratory distress and does not appear short of breath.  Pulse is 96 bpm.  Ill-appearing.  On exam she does not have any swelling, wounds, erythema or tenderness of elbow but has painful full range of motion of elbow.  Throat clear.  No nasal congestion.  Chest clear.  Abdomen soft with mild left lower quadrant tenderness. No CVA tenderness.  Concern for possible sepsis which could be due to UTI, pneumonia. Cannot rule out septic joint. Advised ED. Discussed with patient's son. Patient's son will take to ER. Leaving in stable condition.    Final Clinical Impressions(s) / UC Diagnoses   Final diagnoses:  Fever, unspecified  Low oxygen saturation  Left elbow pain     Discharge Instructions      -Needs workup for sepsis. She could have a urinary infection, pneumonia, COVID/flu, elbow infection. -Please go immediately to ER  You have been advised to follow up immediately in the emergency department for concerning signs.symptoms. If you declined EMS transport, please have a family member take you directly to the ED at this time. Do not delay. Based on concerns about condition, if you do not follow up in th e ED, you may risk poor outcomes including worsening of condition, delayed treatment and potentially life threatening issues. If you have declined to go to the ED at this time, you should call your PCP immediately to set up a follow up appointment.  Go to ED for red flag symptoms, including; fevers you cannot reduce with Tylenol/Motrin, severe headaches, vision changes, numbness/weakness in part of the body, lethargy, confusion, intractable vomiting, severe dehydration, chest pain, breathing difficulty, severe persistent abdominal or pelvic pain, signs of severe infection (increased redness, swelling of an area), feeling faint or passing out, dizziness, etc. You should especially go to the ED for sudden acute worsening of condition if you do not elect to go  at this time.      ED Prescriptions   None    PDMP not reviewed this encounter.   Arvis Jolan NOVAK, PA-C 08/31/23 1935

## 2023-08-31 NOTE — ED Notes (Signed)
 Patient is being discharged from the Urgent Care and sent to the Emergency Department via Personal Vehicle . Per [place], [place], patient is in need of higher level of care due to Possible Sepsis. Patient is aware and verbalizes understanding of plan of care.  Vitals:   08/31/23 1854 08/31/23 1855  BP:  133/71  Pulse: 96   Resp:  18  Temp: (!) 101.1 F (38.4 C)   SpO2:  93%

## 2023-09-26 ENCOUNTER — Other Ambulatory Visit: Payer: Self-pay | Admitting: Family Medicine

## 2023-09-26 DIAGNOSIS — Z1231 Encounter for screening mammogram for malignant neoplasm of breast: Secondary | ICD-10-CM

## 2023-10-30 ENCOUNTER — Ambulatory Visit
Admission: RE | Admit: 2023-10-30 | Discharge: 2023-10-30 | Disposition: A | Discharge status: Home / Self Care | Source: Ambulatory Visit | Attending: Family Medicine | Admitting: Family Medicine

## 2023-10-30 DIAGNOSIS — Z1231 Encounter for screening mammogram for malignant neoplasm of breast: Secondary | ICD-10-CM | POA: Diagnosis present
